# Patient Record
Sex: Female | Born: 1955 | Race: Black or African American | Hispanic: No | Marital: Married | Smoking: Never smoker
Health system: Southern US, Community
[De-identification: ages and names within clinical notes are randomized; demographics above are authoritative.]

## PROBLEM LIST (undated history)

## (undated) DIAGNOSIS — J302 Other seasonal allergic rhinitis: Secondary | ICD-10-CM

## (undated) DIAGNOSIS — F32A Depression, unspecified: Secondary | ICD-10-CM

## (undated) DIAGNOSIS — I341 Nonrheumatic mitral (valve) prolapse: Secondary | ICD-10-CM

## (undated) DIAGNOSIS — M1990 Unspecified osteoarthritis, unspecified site: Secondary | ICD-10-CM

## (undated) DIAGNOSIS — I10 Essential (primary) hypertension: Secondary | ICD-10-CM

## (undated) DIAGNOSIS — K219 Gastro-esophageal reflux disease without esophagitis: Secondary | ICD-10-CM

## (undated) DIAGNOSIS — F419 Anxiety disorder, unspecified: Secondary | ICD-10-CM

## (undated) DIAGNOSIS — J45909 Unspecified asthma, uncomplicated: Secondary | ICD-10-CM

## (undated) HISTORY — PX: ABDOMINAL HYSTERECTOMY: SHX81

## (undated) HISTORY — DX: Unspecified osteoarthritis, unspecified site: M19.90

## (undated) HISTORY — DX: Gastro-esophageal reflux disease without esophagitis: K21.9

## (undated) HISTORY — DX: Nonrheumatic mitral (valve) prolapse: I34.1

## (undated) HISTORY — DX: Unspecified asthma, uncomplicated: J45.909

---

## 2008-03-03 DIAGNOSIS — I639 Cerebral infarction, unspecified: Secondary | ICD-10-CM

## 2008-03-03 HISTORY — DX: Cerebral infarction, unspecified: I63.9

## 2013-08-01 HISTORY — PX: COLONOSCOPY: SHX174

## 2015-11-13 DIAGNOSIS — J4520 Mild intermittent asthma, uncomplicated: Secondary | ICD-10-CM | POA: Insufficient documentation

## 2015-11-13 DIAGNOSIS — J310 Chronic rhinitis: Secondary | ICD-10-CM | POA: Insufficient documentation

## 2015-12-24 ENCOUNTER — Encounter: Payer: Self-pay | Admitting: Internal Medicine

## 2015-12-25 ENCOUNTER — Other Ambulatory Visit: Payer: Self-pay | Admitting: Family Medicine

## 2015-12-25 DIAGNOSIS — Z1231 Encounter for screening mammogram for malignant neoplasm of breast: Secondary | ICD-10-CM

## 2016-01-09 ENCOUNTER — Encounter: Payer: Self-pay | Admitting: Radiology

## 2016-01-09 ENCOUNTER — Ambulatory Visit
Admission: RE | Admit: 2016-01-09 | Discharge: 2016-01-09 | Disposition: A | Discharge status: Home / Self Care | Payer: BLUE CROSS/BLUE SHIELD | Source: Ambulatory Visit | Attending: Family Medicine | Admitting: Family Medicine

## 2016-01-09 DIAGNOSIS — Z1231 Encounter for screening mammogram for malignant neoplasm of breast: Secondary | ICD-10-CM | POA: Diagnosis present

## 2016-01-10 ENCOUNTER — Ambulatory Visit (INDEPENDENT_AMBULATORY_CARE_PROVIDER_SITE_OTHER): Payer: BLUE CROSS/BLUE SHIELD | Admitting: Gastroenterology

## 2016-01-10 ENCOUNTER — Other Ambulatory Visit: Payer: Self-pay

## 2016-01-10 ENCOUNTER — Encounter: Payer: Self-pay | Admitting: Gastroenterology

## 2016-01-10 DIAGNOSIS — K219 Gastro-esophageal reflux disease without esophagitis: Secondary | ICD-10-CM

## 2016-01-10 DIAGNOSIS — R1013 Epigastric pain: Secondary | ICD-10-CM | POA: Diagnosis not present

## 2016-01-10 NOTE — Progress Notes (Signed)
Primary Care Physician:  WHITE, Arlyss RepressELIZABETH BURNEY, NP  Primary Gastroenterologist:  Roetta SessionsMichael Rourk, MD   Chief Complaint  Patient presents with  . Gastroesophageal Reflux    couple months, was taking Dexilant(stopped last yr)/Omeprazole (stopped last month), excess saliva    HPI:  Julie Mooney is a 60 y.o. female here at the request of PCP for further evaluation of GERD. She reports 2-3 year h/o GERD. Well-established with a gastroenterologist in [place] but none since moved to [place]. Reports prior EGD/TCS. Records have been requested. She says she has h/o ulcers, previously treated for H.pylori, no colon polyps.   Previously was on Dexilant. Worked very well for her heartburn symptoms but patient started having cramps and was forgetful and she was concerned it was due to the medication. She started Vit D, Magnesium, Calcium supplement on her own and stopped the Dexilant. She did ok for about six months. 3 months ago she started having bad GERD symptoms again. Nocturnal symptoms severe, waking her up at 3am consistently. She went back to visit her GI in [place] and started on omeprazole which seemed to help some during the daytime. Has also used a lot of TUMS, Pepcid, Zantac, Ginger tea.   Patient established care with her current PCP couple of months ago. Discussion was had regarding PPIs and potential nutritional deficiencies and associated with dementia. Patient was instructed to wean herself off omeprazole. Patient states she cannot tolerate being off PPI. Zantac recommended but did not help. She has now been back on omeprazole for one week and feeling some improvement. She denies dysphagia, vomiting, abdominal pain, constipation, diarrhea, melena, brbpr, unintentional weight loss.   Recent H.pylori breath test was negative.   Lipase 57H on 10/23/2015.  Current Outpatient Prescriptions  Medication Sig Dispense Refill  . albuterol (PROVENTIL HFA;VENTOLIN HFA) 108 (90 Base) MCG/ACT inhaler Inhale 2 puffs  into the lungs as needed.    Marland Kitchen. aspirin EC 81 MG tablet Take 81 mg by mouth daily.    . Multiple Vitamin (MULTI-VITAMINS) TABS Take 1 tablet by mouth daily.    . NON FORMULARY Vitamin D, zinc, calcium once a day    . olopatadine (PATANOL) 0.1 % ophthalmic solution Apply 2 drops to eye as needed.    Marland Kitchen. omeprazole (PRILOSEC) 20 MG capsule Take 20 mg by mouth daily.    . propranolol (INDERAL) 20 MG tablet Take 20 mg by mouth daily.     No current facility-administered medications for this visit.     Allergies as of 01/10/2016 - Review Complete 01/10/2016  Allergen Reaction Noted  . Shrimp [shellfish allergy] Other (See Comments) 01/10/2016    Past Medical History:  Diagnosis Date  . Arthritis   . Asthma   . GERD (gastroesophageal reflux disease)   . MVP (mitral valve prolapse)     Past Surgical History:  Procedure Laterality Date  . ABDOMINAL HYSTERECTOMY      Family History  Problem Relation Age of Onset  . Breast cancer Maternal Aunt 50  . Breast cancer Maternal Aunt   . Breast cancer Maternal Aunt   . Colon cancer Neg Hx     Social History   Social History  . Marital status: Married    Spouse name: N/A  . Number of children: N/A  . Years of education: N/A   Occupational History  . Not on file.   Social History Main Topics  . Smoking status: Never Smoker  . Smokeless tobacco: Never Used  . Alcohol use No  .  Drug use: Unknown  . Sexual activity: Not on file   Other Topics Concern  . Not on file   Social History Narrative   Retired - worked for school systems      ROS:  General: Negative for anorexia, weight loss, fever, chills, fatigue, weakness. Eyes: Negative for vision changes.  ENT: Negative for hoarseness, difficulty swallowing , nasal congestion. CV: Negative for chest pain, angina, palpitations, dyspnea on exertion, peripheral edema.  Respiratory: Negative for dyspnea at rest, dyspnea on exertion, cough, sputum, wheezing.  GI: See history of  present illness. GU:  Negative for dysuria, hematuria, urinary incontinence, urinary frequency, nocturnal urination.  MS: Negative for joint pain, low back pain.  Derm: Negative for rash or itching.  Neuro: Negative for weakness, abnormal sensation, seizure, frequent headaches, memory loss, confusion.  Psych: Negative for anxiety, depression, suicidal ideation, hallucinations.  Endo: Negative for unusual weight change.  Heme: Negative for bruising or bleeding. Allergy: Negative for rash or hives.    Physical Examination:  BP 111/81   Pulse 72   Temp 98.2 F (36.8 C) (Oral)   Ht 5\' 3"  (1.6 m)   Wt 108 lb 12.8 oz (49.4 kg)   BMI 19.27 kg/m    General: Well-nourished, well-developed in no acute distress.  Head: Normocephalic, atraumatic.   Eyes: Conjunctiva pink, no icterus. Mouth: Oropharyngeal mucosa moist and pink , no lesions erythema or exudate. Neck: Supple without thyromegaly, masses, or lymphadenopathy.  Lungs: Clear to auscultation bilaterally.  Heart: Regular rate and rhythm, no murmurs rubs or gallops.  Abdomen: Bowel sounds are normal, mild epigastric tenderness. nondistended, no hepatosplenomegaly or masses, no abdominal bruits or    hernia , no rebound or guarding. With palpation in the epigastrium, patient had frequent belching   Rectal: not performed Extremities: No lower extremity edema. No clubbing or deformities.  Neuro: Alert and oriented x 4 , grossly normal neurologically.  Skin: Warm and dry, no rash or jaundice.   Psych: Alert and cooperative, normal mood and affect.  Labs: Labs from August 2017 White blood cell count 9200, hemoglobin 11.9 slightly low, hematocrit 36, MCV 100 slightly high, platelets 255,000, total bilirubin 0.5, alkaline phosphatase 50, AST 26, ALT 20, albumin 4, lipase 57H  Imaging Studies: No results found.

## 2016-01-10 NOTE — Assessment & Plan Note (Addendum)
[age] year old female with several year history of typical GERD symptoms, reported ulcers, H. pylori status post eradication who presents with complaints of refractory heartburn, epigastric tenderness. She has had difficulty coming off of PPI therapy in the last couple months. Urged to discontinue therapy due to potential nutritional deficiency concerns and association with dementia at the request of her PCP. Patient complains predominantly of nocturnal symptoms. On exam she has epigastric tenderness. Minimally elevated lipase, slight macrocytic anemia. Recommend upper endoscopy for further evaluation of her symptoms. I have discussed the risks, alternatives, benefits with regards to but not limited to the risk of reaction to medication, bleeding, infection, perforation and the patient is agreeable to proceed. Written consent to be obtained.  She will continue omeprazole 20 mg daily for now. If upper endoscopy is unrevealing, she may need further workup. I suspect minimally elevated lipases insignificant finding but based on epigastric tenderness, if EGD is unremarkable, would consider possible imaging of the pancreas.

## 2016-01-10 NOTE — Patient Instructions (Signed)
No PA needed for EGD. Ref # [id number]

## 2016-01-10 NOTE — Patient Instructions (Signed)
1. Continue omeprazole 30 minutes before breakfast daily. 2. Upper endoscopy as scheduled. Please see separate instructions.

## 2016-01-14 ENCOUNTER — Ambulatory Visit (HOSPITAL_COMMUNITY)
Admission: RE | Admit: 2016-01-14 | Discharge: 2016-01-14 | Disposition: A | Discharge status: Home / Self Care | Payer: BLUE CROSS/BLUE SHIELD | Source: Ambulatory Visit | Attending: Internal Medicine | Admitting: Internal Medicine

## 2016-01-14 ENCOUNTER — Encounter (HOSPITAL_COMMUNITY): Payer: Self-pay | Admitting: *Deleted

## 2016-01-14 ENCOUNTER — Encounter (HOSPITAL_COMMUNITY)
Admission: RE | Disposition: A | Discharge status: Home / Self Care | Payer: Self-pay | Source: Ambulatory Visit | Attending: Internal Medicine

## 2016-01-14 DIAGNOSIS — Z91013 Allergy to seafood: Secondary | ICD-10-CM | POA: Diagnosis not present

## 2016-01-14 DIAGNOSIS — R1013 Epigastric pain: Secondary | ICD-10-CM | POA: Diagnosis not present

## 2016-01-14 DIAGNOSIS — Z803 Family history of malignant neoplasm of breast: Secondary | ICD-10-CM | POA: Diagnosis not present

## 2016-01-14 DIAGNOSIS — K219 Gastro-esophageal reflux disease without esophagitis: Secondary | ICD-10-CM | POA: Insufficient documentation

## 2016-01-14 DIAGNOSIS — M1990 Unspecified osteoarthritis, unspecified site: Secondary | ICD-10-CM | POA: Diagnosis not present

## 2016-01-14 DIAGNOSIS — Z79899 Other long term (current) drug therapy: Secondary | ICD-10-CM | POA: Diagnosis not present

## 2016-01-14 DIAGNOSIS — Z7982 Long term (current) use of aspirin: Secondary | ICD-10-CM | POA: Diagnosis not present

## 2016-01-14 DIAGNOSIS — J45909 Unspecified asthma, uncomplicated: Secondary | ICD-10-CM | POA: Diagnosis not present

## 2016-01-14 DIAGNOSIS — I341 Nonrheumatic mitral (valve) prolapse: Secondary | ICD-10-CM | POA: Insufficient documentation

## 2016-01-14 DIAGNOSIS — Z90710 Acquired absence of both cervix and uterus: Secondary | ICD-10-CM | POA: Diagnosis not present

## 2016-01-14 HISTORY — PX: ESOPHAGOGASTRODUODENOSCOPY: SHX5428

## 2016-01-14 HISTORY — DX: Other seasonal allergic rhinitis: J30.2

## 2016-01-14 SURGERY — EGD (ESOPHAGOGASTRODUODENOSCOPY)
Anesthesia: Moderate Sedation

## 2016-01-14 MED ORDER — LIDOCAINE VISCOUS 2 % MT SOLN
OROMUCOSAL | Status: AC
  Filled 2016-01-14: qty 15

## 2016-01-14 MED ORDER — MEPERIDINE HCL 100 MG/ML IJ SOLN
INTRAMUSCULAR | Status: DC | PRN
  Administered 2016-01-14: 25 mg via INTRAVENOUS
  Administered 2016-01-14: 50 mg via INTRAVENOUS

## 2016-01-14 MED ORDER — MIDAZOLAM HCL 5 MG/5ML IJ SOLN
INTRAMUSCULAR | Status: AC
  Filled 2016-01-14: qty 10

## 2016-01-14 MED ORDER — SODIUM CHLORIDE 0.9 % IV SOLN
INTRAVENOUS | Status: DC
  Administered 2016-01-14: 13:00:00 via INTRAVENOUS

## 2016-01-14 MED ORDER — STERILE WATER FOR IRRIGATION IR SOLN
Status: DC | PRN
  Administered 2016-01-14: 13:00:00

## 2016-01-14 MED ORDER — MIDAZOLAM HCL 5 MG/5ML IJ SOLN
INTRAMUSCULAR | Status: DC | PRN
  Administered 2016-01-14: 1 mg via INTRAVENOUS
  Administered 2016-01-14: 2 mg via INTRAVENOUS

## 2016-01-14 MED ORDER — MEPERIDINE HCL 100 MG/ML IJ SOLN
INTRAMUSCULAR | Status: AC
  Filled 2016-01-14: qty 2

## 2016-01-14 MED ORDER — LIDOCAINE VISCOUS 2 % MT SOLN
OROMUCOSAL | Status: DC | PRN
  Administered 2016-01-14: 1 via OROMUCOSAL

## 2016-01-14 MED ORDER — ONDANSETRON HCL 4 MG/2ML IJ SOLN
INTRAMUSCULAR | Status: DC | PRN
  Administered 2016-01-14: 4 mg via INTRAVENOUS

## 2016-01-14 MED ORDER — ONDANSETRON HCL 4 MG/2ML IJ SOLN
INTRAMUSCULAR | Status: AC
  Filled 2016-01-14: qty 2

## 2016-01-14 NOTE — H&P (View-Only) (Signed)
Primary Care Physician:  WHITE, ELIZABETH BURNEY, NP  Primary Gastroenterologist:  Michael Rourk, MD   Chief Complaint  Patient presents with  . Gastroesophageal Reflux    couple months, was taking Dexilant(stopped last yr)/Omeprazole (stopped last month), excess saliva    HPI:  Julie Mooney is a 60 y.o. female here at the request of PCP for further evaluation of GERD. She reports 2-3 year h/o GERD. Well-established with a gastroenterologist in [place] but none since moved to [place]. Reports prior EGD/TCS. Records have been requested. She says she has h/o ulcers, previously treated for H.pylori, no colon polyps.   Previously was on Dexilant. Worked very well for her heartburn symptoms but patient started having cramps and was forgetful and she was concerned it was due to the medication. She started Vit D, Magnesium, Calcium supplement on her own and stopped the Dexilant. She did ok for about six months. 3 months ago she started having bad GERD symptoms again. Nocturnal symptoms severe, waking her up at 3am consistently. She went back to visit her GI in [place] and started on omeprazole which seemed to help some during the daytime. Has also used a lot of TUMS, Pepcid, Zantac, Ginger tea.   Patient established care with her current PCP couple of months ago. Discussion was had regarding PPIs and potential nutritional deficiencies and associated with dementia. Patient was instructed to wean herself off omeprazole. Patient states she cannot tolerate being off PPI. Zantac recommended but did not help. She has now been back on omeprazole for one week and feeling some improvement. She denies dysphagia, vomiting, abdominal pain, constipation, diarrhea, melena, brbpr, unintentional weight loss.   Recent H.pylori breath test was negative.   Lipase 57H on 10/23/2015.  Current Outpatient Prescriptions  Medication Sig Dispense Refill  . albuterol (PROVENTIL HFA;VENTOLIN HFA) 108 (90 Base) MCG/ACT inhaler Inhale 2 puffs  into the lungs as needed.    . aspirin EC 81 MG tablet Take 81 mg by mouth daily.    . Multiple Vitamin (MULTI-VITAMINS) TABS Take 1 tablet by mouth daily.    . NON FORMULARY Vitamin D, zinc, calcium once a day    . olopatadine (PATANOL) 0.1 % ophthalmic solution Apply 2 drops to eye as needed.    . omeprazole (PRILOSEC) 20 MG capsule Take 20 mg by mouth daily.    . propranolol (INDERAL) 20 MG tablet Take 20 mg by mouth daily.     No current facility-administered medications for this visit.     Allergies as of 01/10/2016 - Review Complete 01/10/2016  Allergen Reaction Noted  . Shrimp [shellfish allergy] Other (See Comments) 01/10/2016    Past Medical History:  Diagnosis Date  . Arthritis   . Asthma   . GERD (gastroesophageal reflux disease)   . MVP (mitral valve prolapse)     Past Surgical History:  Procedure Laterality Date  . ABDOMINAL HYSTERECTOMY      Family History  Problem Relation Age of Onset  . Breast cancer Maternal Aunt 50  . Breast cancer Maternal Aunt   . Breast cancer Maternal Aunt   . Colon cancer Neg Hx     Social History   Social History  . Marital status: Married    Spouse name: N/A  . Number of children: N/A  . Years of education: N/A   Occupational History  . Not on file.   Social History Main Topics  . Smoking status: Never Smoker  . Smokeless tobacco: Never Used  . Alcohol use No  .   Drug use: Unknown  . Sexual activity: Not on file   Other Topics Concern  . Not on file   Social History Narrative   Retired - worked for school systems      ROS:  General: Negative for anorexia, weight loss, fever, chills, fatigue, weakness. Eyes: Negative for vision changes.  ENT: Negative for hoarseness, difficulty swallowing , nasal congestion. CV: Negative for chest pain, angina, palpitations, dyspnea on exertion, peripheral edema.  Respiratory: Negative for dyspnea at rest, dyspnea on exertion, cough, sputum, wheezing.  GI: See history of  present illness. GU:  Negative for dysuria, hematuria, urinary incontinence, urinary frequency, nocturnal urination.  MS: Negative for joint pain, low back pain.  Derm: Negative for rash or itching.  Neuro: Negative for weakness, abnormal sensation, seizure, frequent headaches, memory loss, confusion.  Psych: Negative for anxiety, depression, suicidal ideation, hallucinations.  Endo: Negative for unusual weight change.  Heme: Negative for bruising or bleeding. Allergy: Negative for rash or hives.    Physical Examination:  BP 111/81   Pulse 72   Temp 98.2 F (36.8 C) (Oral)   Ht 5\' 3"  (1.6 m)   Wt 108 lb 12.8 oz (49.4 kg)   BMI 19.27 kg/m    General: Well-nourished, well-developed in no acute distress.  Head: Normocephalic, atraumatic.   Eyes: Conjunctiva pink, no icterus. Mouth: Oropharyngeal mucosa moist and pink , no lesions erythema or exudate. Neck: Supple without thyromegaly, masses, or lymphadenopathy.  Lungs: Clear to auscultation bilaterally.  Heart: Regular rate and rhythm, no murmurs rubs or gallops.  Abdomen: Bowel sounds are normal, mild epigastric tenderness. nondistended, no hepatosplenomegaly or masses, no abdominal bruits or    hernia , no rebound or guarding. With palpation in the epigastrium, patient had frequent belching   Rectal: not performed Extremities: No lower extremity edema. No clubbing or deformities.  Neuro: Alert and oriented x 4 , grossly normal neurologically.  Skin: Warm and dry, no rash or jaundice.   Psych: Alert and cooperative, normal mood and affect.  Labs: Labs from August 2017 White blood cell count 9200, hemoglobin 11.9 slightly low, hematocrit 36, MCV 100 slightly high, platelets 255,000, total bilirubin 0.5, alkaline phosphatase 50, AST 26, ALT 20, albumin 4, lipase 57H  Imaging Studies: No results found.

## 2016-01-14 NOTE — Interval H&P Note (Signed)
History and Physical Interval Note:  01/14/2016 12:55 PM  Julie Mooney  has presented today for surgery, with the diagnosis of GERD, epig pain  The various methods of treatment have been discussed with the patient and family. After consideration of risks, benefits and other options for treatment, the patient has consented to  Procedure(s) with comments: ESOPHAGOGASTRODUODENOSCOPY (EGD) (N/A) - 1:30 pm as a surgical intervention .  The patient's history has been reviewed, patient examined, no change in status, stable for surgery.  I have reviewed the patient's chart and labs.  Questions were answered to the patient's satisfaction.     Julie Mooney  No change. Patient denies dysphagia. Diagnostic EGD for plan.  The risks, benefits, limitations, alternatives and imponderables have been reviewed with the patient. Questions have been answered. All parties are agreeable.

## 2016-01-14 NOTE — Discharge Instructions (Signed)
EGD Discharge instructions Please read the instructions outlined below and refer to this sheet in the next few weeks. These discharge instructions provide you with general information on caring for yourself after you leave the hospital. Your doctor may also give you specific instructions. While your treatment has been planned according to the most current medical practices available, unavoidable complications occasionally occur. If you have any problems or questions after discharge, please call your doctor. ACTIVITY  You may resume your regular activity but move at a slower pace for the next 24 hours.   Take frequent rest periods for the next 24 hours.   Walking will help expel (get rid of) the air and reduce the bloated feeling in your abdomen.   No driving for 24 hours (because of the anesthesia (medicine) used during the test).   You may shower.   Do not sign any important legal documents or operate any machinery for 24 hours (because of the anesthesia used during the test).  NUTRITION  Drink plenty of fluids.   You may resume your normal diet.   Begin with a light meal and progress to your normal diet.   Avoid alcoholic beverages for 24 hours or as instructed by your caregiver.  MEDICATIONS  You may resume your normal medications unless your caregiver tells you otherwise.  WHAT YOU CAN EXPECT TODAY  You may experience abdominal discomfort such as a feeling of fullness or gas pains.  FOLLOW-UP  Your doctor will discuss the results of your test with you.  SEEK IMMEDIATE MEDICAL ATTENTION IF ANY OF THE FOLLOWING OCCUR:  Excessive nausea (feeling sick to your stomach) and/or vomiting.   Severe abdominal pain and distention (swelling).   Trouble swallowing.   Temperature over 101 F (37.8 C).   Rectal bleeding or vomiting of blood.    Your EGD was normal today  Try a course of Protonix 40 mg daily  We'll order a CT of the abdomen and pelvis-pancreatic protocol to  further evaluate your abdominal pain  Office visit with us in 4-6 weeks  Further recommendations to follow.

## 2016-01-15 ENCOUNTER — Other Ambulatory Visit: Payer: Self-pay

## 2016-01-15 ENCOUNTER — Telehealth: Payer: Self-pay

## 2016-01-15 DIAGNOSIS — R109 Unspecified abdominal pain: Secondary | ICD-10-CM

## 2016-01-15 NOTE — Telephone Encounter (Signed)
noted 

## 2016-01-15 NOTE — Telephone Encounter (Signed)
Pt would like to talk with RMR about the CT scan. She does not understand why she needs to have it done. I explained it was to eval for her abd pain and she said that she is not having any pain. She just would like to talk with RMR before the CT scan (which is on 01/21/16). Her contact number is [phone]

## 2016-01-15 NOTE — Telephone Encounter (Signed)
I Called patient. States abdominal pain is better. Has concerns about getting the CT scan. Doesn't think she needs it. Reviewed negative EGD findings. I suggested we hold off on the CT and just see her back in the office on December 18  -  see how she is doing and go from there.

## 2016-01-15 NOTE — Progress Notes (Signed)
CC'ED TO PCP 

## 2016-01-16 NOTE — Op Note (Signed)
[facility] Patient Name: Julie MedicoJanet Mooney Procedure Date: 01/14/2016 12:53 PM MRN: [id number] Date of Birth: May 11, 1955 Attending MD: Gennette Pacobert Michael Rourk , MD CSN: [id number] Age: [age] Admit Type: Outpatient Procedure:                Upper GI endoscopy Indications:              Epigastric abdominal pain Providers:                Gennette Pacobert Michael Rourk, MD, Jannett CelestineAnitra Bell, RN, Birder Robsonebra                            [place], Technician Referring MD:              Medicines:                Midazolam 2 mg IV, Meperidine 50 mg IV, Ondansetron                            4 mg IV Complications:            No immediate complications. Estimated Blood Loss:     Estimated blood loss: none. Procedure:                Pre-Anesthesia Assessment:                           - Prior to the procedure, a History and Physical                            was performed, and patient medications and                            allergies were reviewed. The patient's tolerance of                            previous anesthesia was also reviewed. The risks                            and benefits of the procedure and the sedation                            options and risks were discussed with the patient.                            All questions were answered, and informed consent                            was obtained. Prior Anticoagulants: The patient has                            taken no previous anticoagulant or antiplatelet                            agents. ASA Grade Assessment: III - A patient with  severe systemic disease. After reviewing the risks                            and benefits, the patient was deemed in                            satisfactory condition to undergo the procedure.                           After obtaining informed consent, the endoscope was                            passed under direct vision. Throughout the                            procedure, the patient's  blood pressure, pulse, and                            oxygen saturations were monitored continuously. The                            [id number] ([id number]) scope was introduced through the                            mouth, and advanced to the second part of duodenum.                            The upper GI endoscopy was accomplished without                            difficulty. The patient tolerated the procedure                            well. Scope In: 1:14:35 PM Scope Out: 1:18:13 PM Total Procedure Duration: 0 hours 3 minutes 38 seconds  Findings:      The examined esophagus was normal.      The in the stomach was normal.      The duodenal bulb and second portion of the duodenum were normal. Impression:               - Normal esophagus.                           - Normal stomach.                           - Normal duodenal bulb and second portion of the                            duodenum.                           - No specimens collected. Moderate Sedation:      Moderate (conscious) sedation was administered by the endoscopy nurse       and supervised by the endoscopist. The following parameters were  monitored: oxygen saturation, heart rate, blood pressure, respiratory       rate, EKG, adequacy of pulmonary ventilation, and response to care.       Total physician intraservice time was 15 minutes. Recommendation:           - Patient has a contact number available for                            emergencies. The signs and symptoms of potential                            delayed complications were discussed with the                            patient. Return to normal activities tomorrow.                            Written discharge instructions were provided to the                            patient.                           - Resume previous diet. Proceed with a pancreatic                            protocol CT scan. Trial of Protonix 40 mg daily.                           -  Continue present medications.                           - No repeat upper endoscopy.                           - Return to GI office (date not yet determined). Procedure Code(s):        --- Professional ---                           [phone], Moderate sedation services provided by the                            same physician or other qualified health care                            professional performing the diagnostic or                            therapeutic service that the sedation supports,                            requiring the presence of an independent trained                            observer to assist in the monitoring of the  patient's level of consciousness and physiological                            status; initial 15 minutes of intraservice time,                            patient age 29 years or older Diagnosis Code(s):        --- Professional ---                           R10.13, Epigastric pain CPT copyright 2016 American Medical Association. All rights reserved. The codes documented in this report are preliminary and upon coder review may  be revised to meet current compliance requirements. Gerrit Friends. Rourk, MD Gennette Pac, MD 01/16/2016 1:44:12 PM This report has been signed electronically. Number of Addenda: 0

## 2016-01-17 ENCOUNTER — Encounter (HOSPITAL_COMMUNITY): Payer: Self-pay | Admitting: Internal Medicine

## 2016-01-17 NOTE — Telephone Encounter (Signed)
Pt called and wanted to go ahead and schedule her Ct scan. She is set up for 01/22/16 @ 6:54 pm and she is aware.

## 2016-01-21 ENCOUNTER — Ambulatory Visit (HOSPITAL_COMMUNITY): Payer: BLUE CROSS/BLUE SHIELD

## 2016-01-21 ENCOUNTER — Other Ambulatory Visit: Payer: Self-pay

## 2016-01-21 NOTE — Patient Instructions (Signed)
Attempted to get Authorization for CT Abd/pelvis with and without contrast that is scheduled for 01/22/16. Clinical reviewer sending info to physician reviewer. Ref# [id number].

## 2016-01-22 ENCOUNTER — Telehealth: Payer: Self-pay

## 2016-01-22 ENCOUNTER — Ambulatory Visit (HOSPITAL_COMMUNITY): Admission: RE | Admit: 2016-01-22 | Payer: BLUE CROSS/BLUE SHIELD | Source: Ambulatory Visit

## 2016-01-22 NOTE — Telephone Encounter (Signed)
Pt called and said CT abd/pelvis that was scheduled for today had to be rescheduled for 01/29/16 d/t her insurance hasn't approved test yet. Pt also said she has mitral valve prolapse and requires antibiotic for invasive procedures (fyi).

## 2016-01-23 NOTE — Telephone Encounter (Signed)
Unable to access case on [employer] website. Case# [id number]. Called [employer] [phone]([id number]), case is still pending.

## 2016-01-28 NOTE — Telephone Encounter (Signed)
Patient called back with an approval number for her ct scan #[id number]  I also [facility] and made [facility] aware.

## 2016-01-28 NOTE — Telephone Encounter (Signed)
I [facility] a [facility] from [facility] stating [facility] was [facility].  I called [employer] and spoke with Rosamaria LintsMyiesha W and she stated they had [facility] in [place] listed as the imaging site.  I was told that the patient needed to call [employer] at [phone] and she would have to change the imaging center to [facility].    She needs to call [employer] at [phone] and speak with her benefits coordinator and change her address, because they still have a [place], [place] address on file for her.  She would then need to call [employer] at [phone] and change the facility to [facility].  I called the patient and made her aware of this.

## 2016-01-29 ENCOUNTER — Ambulatory Visit (HOSPITAL_COMMUNITY)
Admission: RE | Admit: 2016-01-29 | Discharge: 2016-01-29 | Disposition: A | Discharge status: Home / Self Care | Payer: BLUE CROSS/BLUE SHIELD | Source: Ambulatory Visit | Attending: Internal Medicine | Admitting: Internal Medicine

## 2016-01-29 DIAGNOSIS — R109 Unspecified abdominal pain: Secondary | ICD-10-CM | POA: Diagnosis not present

## 2016-01-29 LAB — POCT I-STAT CREATININE: Creatinine, Ser: 0.9 mg/dL (ref 0.44–1.00)

## 2016-01-29 MED ORDER — IOPAMIDOL (ISOVUE-300) INJECTION 61%
100.0000 mL | Freq: Once | INTRAVENOUS | Status: AC | PRN
  Administered 2016-01-29: 100 mL via INTRAVENOUS

## 2016-01-29 NOTE — Telephone Encounter (Signed)
I also spoke with Latoya in member services and she made the change from [facility] to [facility] and the reference number is [id number]

## 2016-01-29 NOTE — Telephone Encounter (Signed)
I was on a conference call with the patient and [employer](Mike)and we were able to change the imaging site.  Reference number [id number] U to show the change for the imaging site from [facility] to [facility]

## 2016-01-30 ENCOUNTER — Encounter: Payer: Self-pay | Admitting: Gastroenterology

## 2016-01-30 NOTE — Progress Notes (Signed)
Patient was made aware of RMR's recommendations and will be coming by our office in the morning to get lab order and she has an appt 03/20/16 with LL at 1:30 and she is aware.  Julie FanningJulie, please print off the lab order

## 2016-01-31 ENCOUNTER — Other Ambulatory Visit: Payer: Self-pay

## 2016-01-31 ENCOUNTER — Other Ambulatory Visit: Payer: Self-pay | Admitting: Internal Medicine

## 2016-01-31 DIAGNOSIS — K760 Fatty (change of) liver, not elsewhere classified: Secondary | ICD-10-CM

## 2016-02-01 LAB — HEPATIC FUNCTION PANEL
ALBUMIN: 3.8 g/dL (ref 3.6–5.1)
ALT: 14 U/L (ref 6–29)
AST: 19 U/L (ref 10–35)
Alkaline Phosphatase: 40 U/L (ref 33–130)
BILIRUBIN INDIRECT: 0.4 mg/dL (ref 0.2–1.2)
Bilirubin, Direct: 0.1 mg/dL (ref <=0.2)
TOTAL PROTEIN: 6.1 g/dL (ref 6.1–8.1)
Total Bilirubin: 0.5 mg/dL (ref 0.2–1.2)

## 2016-02-06 ENCOUNTER — Telehealth: Payer: Self-pay | Admitting: General Practice

## 2016-02-06 NOTE — Telephone Encounter (Signed)
Patient called in to get her results from her lab work.  I made her aware of her results and told her she should be receiving a letter in the mail.

## 2016-02-18 ENCOUNTER — Ambulatory Visit: Payer: BLUE CROSS/BLUE SHIELD | Admitting: Gastroenterology

## 2016-03-19 ENCOUNTER — Encounter: Payer: Self-pay | Admitting: Gastroenterology

## 2016-03-19 ENCOUNTER — Telehealth: Payer: Self-pay | Admitting: Gastroenterology

## 2016-03-19 NOTE — Telephone Encounter (Signed)
Reviewed records from outside source.  TCS tortuosity of sigmoid colon, moderatly severe proctitis and grade II internal hemorrhoids.   Please see if we can get bx result from [facility]. Dr. Janith LimaSerena Lee.

## 2016-03-20 ENCOUNTER — Ambulatory Visit: Payer: BLUE CROSS/BLUE SHIELD | Admitting: Gastroenterology

## 2016-03-27 NOTE — Telephone Encounter (Signed)
Requested path report

## 2016-04-02 ENCOUNTER — Encounter: Payer: Self-pay | Admitting: Internal Medicine

## 2016-04-11 ENCOUNTER — Ambulatory Visit (INDEPENDENT_AMBULATORY_CARE_PROVIDER_SITE_OTHER): Payer: BLUE CROSS/BLUE SHIELD | Admitting: Gastroenterology

## 2016-04-11 ENCOUNTER — Encounter: Payer: Self-pay | Admitting: Gastroenterology

## 2016-04-11 VITALS — BP 127/76 | HR 67 | Temp 97.6°F | Ht 63.0 in | Wt 110.8 lb

## 2016-04-11 DIAGNOSIS — K219 Gastro-esophageal reflux disease without esophagitis: Secondary | ICD-10-CM | POA: Diagnosis not present

## 2016-04-11 NOTE — Assessment & Plan Note (Signed)
61 year old lady with several year history of typical heartburn which responds to PPI therapy. Because of concerns for potential nutritional deficiencies, recent concerns for dementia related to PPI therapy (claims have not been supported with recent studies) she was advised to come off PPI therapy by her PCP and developed recurrent heartburn. Symptoms have now settled back down, she is taking pantoprazole about 3-4 times weekly. I have advised her to continue taking pantoprazole 3-4 times weekly, may use over-the-counter Rolaids or Tums for when necessary symptoms in between. If however she continues to have significant breakthrough heartburn with this regimen, would advise her to take pantoprazole daily. Reinforced anti-reflex measures. We'll have her come back in one year or she can call sooner if needed.  Currently she is not having any lower GI symptoms. Last colonoscopy 2015, she denies any history of polyps.

## 2016-04-11 NOTE — Patient Instructions (Signed)
1. As discussed, since you would like to minimize you of prescription reflux medications, try taking protonix once every 2-3 days for your reflux. You can take OTC antacids like tums or rolaids as needed.  2. We will see you back in one year or sooner if needed.   Food Choices for Gastroesophageal Reflux Disease, Adult When you have gastroesophageal reflux disease (GERD), the foods you eat and your eating habits are very important. Choosing the right foods can help ease the discomfort of GERD. What general guidelines do I need to follow?  Choose fruits, vegetables, whole grains, low-fat dairy products, and low-fat meat, fish, and poultry.  Limit fats such as oils, salad dressings, butter, nuts, and avocado.  Keep a food diary to identify foods that cause symptoms.  Avoid foods that cause reflux. These may be different for different people.  Eat frequent small meals instead of three large meals each day.  Eat your meals slowly, in a relaxed setting.  Limit fried foods.  Cook foods using methods other than frying.  Avoid drinking alcohol.  Avoid drinking large amounts of liquids with your meals.  Avoid bending over or lying down until 2-3 hours after eating. What foods are not recommended? The following are some foods and drinks that may worsen your symptoms: Vegetables  Tomatoes. Tomato juice. Tomato and spaghetti sauce. Chili peppers. Onion and garlic. Horseradish. Fruits  Oranges, grapefruit, and lemon (fruit and juice). Meats  High-fat meats, fish, and poultry. This includes hot dogs, ribs, ham, sausage, salami, and bacon. Dairy  Whole milk and chocolate milk. Sour cream. Cream. Butter. Ice cream. Cream cheese. Beverages  Coffee and tea, with or without caffeine. Carbonated beverages or energy drinks. Condiments  Hot sauce. Barbecue sauce. Sweets/Desserts  Chocolate and cocoa. Donuts. Peppermint and spearmint. Fats and Oils  High-fat foods, including [place] fries and  potato chips. Other  Vinegar. Strong spices, such as black pepper, white pepper, red pepper, cayenne, curry powder, cloves, ginger, and chili powder. The items listed above may not be a complete list of foods and beverages to avoid. Contact your dietitian for more information.  This information is not intended to replace advice given to you by your health care provider. Make sure you discuss any questions you have with your health care provider. Document Released: 02/17/2005 Document Revised: 07/26/2015 Document Reviewed: 12/22/2012 Elsevier Interactive Patient Education  2017 [employer]

## 2016-04-11 NOTE — Progress Notes (Signed)
Primary Care Physician: WHITE, Arlyss RepressELIZABETH BURNEY, NP  Primary Gastroenterologist:  Roetta SessionsMichael Rourk, MD   Chief Complaint  Patient presents with  . Gastroesophageal Reflux    HPI: Julie Mooney is a 61 y.o. female here for follow-up of GERD. She was last seen in November at the request of PCP. History of GERD for several years. Previously well-established with gastroenterologist in [place] but has since moved to [place]. She reports previous EGD/colonoscopy we've requested records and only received a colonoscopy report which showed sigmoid colon tortuosity, moderately severe proctitis, internal grade 2 hemorrhoids. There was no mention of biopsies being done, we have requested any possible records but have not received anything to date. Patient reports previously being treated for H. pylori and has a history of ulcers.   When she presented back in November her new PCP had discussed potential nutritional deficiencies related to PPIs as well as potential dementia and instructed her to wean herself off the omeprazole. When she did she had significant recurrent heartburn symptoms. Zantac did not provide any relief. When I saw her she been back on omeprazole for 1 week with improvement of her symptoms. H pylori breath test was negative. In August 2017 she had a minimally elevated lipase 57.  EGD in November 2017 by Dr. Jena Gaussourk was entirely normal. He arranged for pancreatic protocol CT which showed scattered lesions in the liver measuring up to 8 mm, too small to characterize but felt to be accommodation of cyst or hematomas. 3.5 cm left hepatic lobe lesion consistent with hemangioma. Probable focal fat in the medial left hepatic lobe. Gallbladder unremarkable. Mild intrahepatic biliary dilation but normal CBD. Pancreas was unremarkable.  Patient states that overall she has been doing well back on PPI therapy. At this time she is taking pantoprazole but has omeprazole at home as well.  Currently not taking daily. Still afraid to take daily because of concerns with memory issues, states takes Dexilant caused her to forget things. Denies dysphagia, abdominal pain. Appetite is good. Briefly discussed antireflux surgery but she's not interested. Reinforced anti-reflex measures. Certain foods safely set her symptoms off. Also worse with stress.    Current Outpatient Prescriptions  Medication Sig Dispense Refill  . albuterol (PROVENTIL HFA;VENTOLIN HFA) 108 (90 Base) MCG/ACT inhaler Inhale 2 puffs into the lungs every 6 (six) hours as needed for wheezing or shortness of breath.     Marland Kitchen. aspirin EC 81 MG tablet Take 81 mg by mouth daily.    . Chlorphen-Pseudoephed-APAP (TYLENOL ALLERGY SINUS PO) Take 1 tablet by mouth daily as needed. For sinuses    . Multiple Vitamin (MULTI-VITAMINS) TABS Take 1 tablet by mouth daily.    . NON FORMULARY Vitamin D, zinc, calcium once a day    . olopatadine (PATANOL) 0.1 % ophthalmic solution Place 2 drops into both eyes 2 (two) times daily.     Marland Kitchen. omeprazole (PRILOSEC) 20 MG capsule Take 20 mg by mouth daily.    . propranolol (INDERAL) 20 MG tablet Take 20 mg by mouth daily.     No current facility-administered medications for this visit.     Allergies as of 04/11/2016 - Review Complete 04/11/2016  Allergen Reaction Noted  . Shrimp [shellfish allergy] Other (See Comments) 01/10/2016    ROS:  General: Negative for anorexia, weight loss, fever, chills, fatigue, weakness. ENT: Negative for hoarseness, difficulty swallowing , nasal congestion. CV: Negative for chest pain, angina, palpitations, dyspnea on exertion, peripheral edema.  Respiratory: Negative for  dyspnea at rest, dyspnea on exertion, cough, sputum, wheezing.  GI: See history of present illness. GU:  Negative for dysuria, hematuria, urinary incontinence, urinary frequency, nocturnal urination.  Endo: Negative for unusual weight change.    Physical Examination:   BP 127/76   Pulse 67    Temp 97.6 F (36.4 C) (Oral)   Ht 5\' 3"  (1.6 m)   Wt 110 lb 12.8 oz (50.3 kg)   BMI 19.63 kg/m   General: Well-nourished, well-developed in no acute distress.  Eyes: No icterus. Mouth: Oropharyngeal mucosa moist and pink , no lesions erythema or exudate. Lungs: Clear to auscultation bilaterally.  Heart: Regular rate and rhythm, no murmurs rubs or gallops.  Abdomen: Bowel sounds are normal, nontender, nondistended, no hepatosplenomegaly or masses, no abdominal bruits or hernia , no rebound or guarding.   Extremities: No lower extremity edema. No clubbing or deformities. Neuro: Alert and oriented x 4   Skin: Warm and dry, no jaundice.   Psych: Alert and cooperative, normal mood and affect.  Labs:  Lab Results  Component Value Date   ALT 14 01/31/2016   AST 19 01/31/2016   ALKPHOS 40 01/31/2016   BILITOT 0.5 01/31/2016    Imaging Studies: No results found.

## 2016-04-14 NOTE — Progress Notes (Signed)
CC'D TO PCP °

## 2016-07-09 DIAGNOSIS — I341 Nonrheumatic mitral (valve) prolapse: Secondary | ICD-10-CM | POA: Insufficient documentation

## 2017-01-13 ENCOUNTER — Other Ambulatory Visit: Payer: Self-pay | Admitting: Family Medicine

## 2017-01-13 DIAGNOSIS — Z1231 Encounter for screening mammogram for malignant neoplasm of breast: Secondary | ICD-10-CM

## 2017-02-05 ENCOUNTER — Ambulatory Visit
Admission: RE | Admit: 2017-02-05 | Discharge: 2017-02-05 | Disposition: A | Discharge status: Home / Self Care | Payer: BLUE CROSS/BLUE SHIELD | Source: Ambulatory Visit | Attending: Family Medicine | Admitting: Family Medicine

## 2017-02-05 DIAGNOSIS — Z1231 Encounter for screening mammogram for malignant neoplasm of breast: Secondary | ICD-10-CM | POA: Diagnosis not present

## 2017-03-10 ENCOUNTER — Encounter: Payer: Self-pay | Admitting: Internal Medicine

## 2017-06-23 ENCOUNTER — Encounter: Payer: Self-pay | Admitting: Nurse Practitioner

## 2017-06-23 ENCOUNTER — Ambulatory Visit: Payer: BLUE CROSS/BLUE SHIELD | Admitting: Nurse Practitioner

## 2017-06-23 VITALS — BP 107/67 | HR 66 | Temp 97.0°F | Ht 63.0 in | Wt 108.8 lb

## 2017-06-23 DIAGNOSIS — R143 Flatulence: Secondary | ICD-10-CM | POA: Diagnosis not present

## 2017-06-23 DIAGNOSIS — K219 Gastro-esophageal reflux disease without esophagitis: Secondary | ICD-10-CM

## 2017-06-23 DIAGNOSIS — R1013 Epigastric pain: Secondary | ICD-10-CM

## 2017-06-23 NOTE — Assessment & Plan Note (Signed)
Overall no epigastric abdominal pain.  This is consistent with likely improvement in GERD symptoms.  Recommend continue PPI as needed as she is resistant to taking this daily.  Follow-up in 2 months.

## 2017-06-23 NOTE — Progress Notes (Signed)
cc'ed to pcp °

## 2017-06-23 NOTE — Assessment & Plan Note (Signed)
She has noted increased flatulence with specific foods.  I recommended she keep a log to identify triggers and avoid these.  She feels she may be lactose intolerant.  I have recommended she go on a lactose-free diet for 2 weeks.  If this resolves her symptoms we can discuss lactose-free options for her.  She wishes to continue a gluten-free diet as well. Follow-up in 2 months.

## 2017-06-23 NOTE — Assessment & Plan Note (Signed)
Overall, GERD seems to be doing okay.  The patient is still resistant to taking daily PPI.  I will provide her with education related to adverse effects from PPIs.  Recommend she take PPI as needed if she will not take it every day.  Continue other current medications.  Follow-up in 2 months.  Last EGD completely normal 2017.

## 2017-06-23 NOTE — Patient Instructions (Signed)
1. Continue to take your acid blocker as needed.  Ideally, you should take it every day. 2. I am giving you a Pranau related to side effects from acid blockers. 3. Attempt a dairy free/lactose-free diet for 2 weeks to see if this helps her gas symptoms. 4. If it does, we can discuss options for dairy free diet to prevent symptoms as well as options for when you do consume dairy. 5. Return for follow-up in 2 months. 6. Continue her other medications. 7. Call if you have any questions or concerns.   It was great to see you today!  You have a wonderful summer!!!     At [facility] we value your feedback. You may receive a survey about your visit today. Please share your experience as we strive to create trusting relationships with our patients to provide genuine, compassionate, quality care.

## 2017-06-23 NOTE — Progress Notes (Signed)
Referring Provider: Titus Mould* Primary Care Physician:  Titus Mould, NP Primary GI:  Dr. Jena Gauss  Chief Complaint  Patient presents with  . Gastroesophageal Reflux    f/u. C/o lots of gas with certain foods    HPI:   Julie Mooney is a 62 y.o. female who presents for follow-up on GERD.  The patient was last seen in our office 04/11/2016 for the same.  Noted chronic history of GERD for multiple years, previously established with GI in [place] but since moved to [place].  Previous request for EGD and colonoscopy records with only colonoscopy report returned which showed sigmoid colon tortuosity, moderately severe proctitis, internal grade 2 hemorrhoids.  Patient reported history of H. pylori status post treatment and history of ulcers.  Primary care recommended she wean off PPIs due to potential nutritional deficiencies and potential dementia.  When she did so she had significant heartburn symptoms and Zantac did not improve her symptoms.  She went back on omeprazole for 1 week and had improvement.    EGD up-to-date November 2017 entirely normal.    Pancreatic protocol CT showed scattered lesions of the liver measuring up to 8 mm too small to characterize but felt to be a combination of cysts or hemangiomas.  3.5 cm left hepatic lobe lesion consistent with hemangioma.  Probable focal fat in the medial left hepatic lobe.  Mild intrahepatic biliary dilation but normal CBD.  Pancreas unremarkable.  At her last visit she was doing well back on PPI, still taking pantoprazole but has omeprazole as well but not taking either daily.  She stated taking Dexilant caused her to forget things.  No other GI symptoms.  Discussion of antireflux surgery was done but the patient is not interested.  Reinforced antireflux measures and trigger avoidance.  Recommended she try Protonix every 2-3 days and over-the-counter antacids like Tums or Rolaids as needed.  Follow-up in 1  year.  Today she states she's been better. Was doing well when she went gluten free for a year, stopped PPI and was doing well. Her mother passed in February or March, started "eating everything" and had return of symptoms. Having increased gas with certain foods (especially fried foods; wonders if she has lactose intolerance). Started taking probiotics. In March she took PPI for a week. Fells better not not completely. Denies abdominal pain, N/V, hematochezia, melena, fever, chills, unintentional weight loss. Denies esophageal burning, bitter taste. When re-asked, feels reflux is probably doing pretty well with re-improved diet. Biggest concern is gas with certain foods.  Past Medical History:  Diagnosis Date  . Arthritis   . Asthma   . GERD (gastroesophageal reflux disease)   . MVP (mitral valve prolapse)   . Seasonal allergies     Past Surgical History:  Procedure Laterality Date  . ABDOMINAL HYSTERECTOMY    . COLONOSCOPY  08/2013   [facility]: tortuosity of sigm colon, moderately severe proctitis internal grade II hemorrhoids.   . ESOPHAGOGASTRODUODENOSCOPY N/A 01/14/2016   Procedure: ESOPHAGOGASTRODUODENOSCOPY (EGD);  Surgeon: Corbin Ade, MD;  Location: [facility];  Service: Endoscopy;  Laterality: N/A;  1:30 pm    Current Outpatient Medications  Medication Sig Dispense Refill  . albuterol (PROVENTIL HFA;VENTOLIN HFA) 108 (90 Base) MCG/ACT inhaler Inhale 2 puffs into the lungs every 6 (six) hours as needed for wheezing or shortness of breath.     Marland Kitchen aspirin EC 81 MG tablet Take 81 mg by mouth daily.    Marland Kitchen  Chlorphen-Pseudoephed-APAP (TYLENOL ALLERGY SINUS PO) Take 1 tablet by mouth daily as needed. For sinuses    . Multiple Vitamin (MULTI-VITAMINS) TABS Take 1 tablet by mouth daily.    . NON FORMULARY Vitamin D, zinc, calcium once a day    . olopatadine (PATANOL) 0.1 % ophthalmic solution Place 2 drops into both eyes as needed.     . propranolol (INDERAL) 20 MG tablet  Take 20 mg by mouth daily.     No current facility-administered medications for this visit.     Allergies as of 06/23/2017 - Review Complete 06/23/2017  Allergen Reaction Noted  . Shrimp [shellfish allergy] Other (See Comments) 01/10/2016    Family History  Problem Relation Age of Onset  . Breast cancer Maternal Aunt 50  . Breast cancer Maternal Aunt   . Breast cancer Maternal Aunt   . Colon cancer Neg Hx     Social History   Socioeconomic History  . Marital status: Married    Spouse name: Not on file  . Number of children: Not on file  . Years of education: Not on file  . Highest education level: Not on file  Occupational History  . Not on file  Social Needs  . Financial resource strain: Not on file  . Food insecurity:    Worry: Not on file    Inability: Not on file  . Transportation needs:    Medical: Not on file    Non-medical: Not on file  Tobacco Use  . Smoking status: Never Smoker  . Smokeless tobacco: Never Used  Substance and Sexual Activity  . Alcohol use: No  . Drug use: Never  . Sexual activity: Not on file  Lifestyle  . Physical activity:    Days per week: Not on file    Minutes per session: Not on file  . Stress: Not on file  Relationships  . Social connections:    Talks on phone: Not on file    Gets together: Not on file    Attends religious service: Not on file    Active member of club or organization: Not on file    Attends meetings of clubs or organizations: Not on file    Relationship status: Not on file  Other Topics Concern  . Not on file  Social History Narrative   Retired - worked for school systems    Review of Systems: General: Negative for anorexia, weight loss, fever, chills, fatigue, weakness. ENT: Negative for hoarseness, difficulty swallowing. CV: Negative for chest pain, angina, palpitations, peripheral edema.  Respiratory: Negative for dyspnea at rest, cough, sputum, wheezing.  GI: See history of present illness. Endo:  Negative for unusual weight change.  Heme: Negative for bruising or bleeding.   Physical Exam: BP 107/67   Pulse 66   Temp (!) 97 F (36.1 C) (Oral)   Ht 5\' 3"  (1.6 m)   Wt 108 lb 12.8 oz (49.4 kg)   BMI 19.27 kg/m  General:   Alert and oriented. Pleasant and cooperative. Well-nourished and well-developed.  Eyes:  Without icterus, sclera clear and conjunctiva pink.  Ears:  Normal auditory acuity. Cardiovascular:  S1, S2 present without murmurs appreciated. Extremities without clubbing or edema. Respiratory:  Clear to auscultation bilaterally. No wheezes, rales, or rhonchi. No distress.  Gastrointestinal:  +BS, soft, non-tender and non-distended. No HSM noted. No guarding or rebound. No masses appreciated.  Rectal:  Deferred  Musculoskalatal:  Symmetrical without gross deformities. Skin:  Intact without significant lesions or rashes. Neurologic:  Alert and oriented x4;  grossly normal neurologically. Psych:  Alert and cooperative. Normal mood and affect. Heme/Lymph/Immune: No excessive bruising noted.    06/23/2017 10:02 AM   Disclaimer: This note was dictated with voice recognition software. Similar sounding words can inadvertently be transcribed and may not be corrected upon review.

## 2017-06-24 ENCOUNTER — Telehealth: Payer: Self-pay | Admitting: Internal Medicine

## 2017-06-24 NOTE — Telephone Encounter (Signed)
[phone] patient would like protonix sent to [facility] on [address], [address]

## 2017-06-24 NOTE — Telephone Encounter (Signed)
Routing to [facility] refill 

## 2017-06-25 MED ORDER — PANTOPRAZOLE SODIUM 40 MG PO TBEC: 40.0000 mg | DELAYED_RELEASE_TABLET | Freq: Every day | ORAL | 3 refills | Status: DC

## 2017-06-25 NOTE — Addendum Note (Signed)
Addended by: Tiffany KocherLEWIS, Leata Dominy S on: 06/25/2017 01:32 PM   Modules accepted: Orders

## 2017-06-25 NOTE — Telephone Encounter (Signed)
Pt returned call and is aware that her Rx was sent to her pharmacy.

## 2017-06-25 NOTE — Telephone Encounter (Signed)
Noted, tried leaving a message for pt, mailbox is full.

## 2017-08-27 ENCOUNTER — Ambulatory Visit: Payer: BLUE CROSS/BLUE SHIELD | Admitting: Gastroenterology

## 2017-09-10 ENCOUNTER — Ambulatory Visit: Payer: BLUE CROSS/BLUE SHIELD | Admitting: Nurse Practitioner

## 2017-09-10 ENCOUNTER — Encounter: Payer: Self-pay | Admitting: Nurse Practitioner

## 2017-09-10 VITALS — BP 126/80 | HR 65 | Temp 97.0°F | Ht 63.0 in | Wt 108.4 lb

## 2017-09-10 DIAGNOSIS — K219 Gastro-esophageal reflux disease without esophagitis: Secondary | ICD-10-CM | POA: Diagnosis not present

## 2017-09-10 DIAGNOSIS — R1013 Epigastric pain: Secondary | ICD-10-CM

## 2017-09-10 NOTE — Progress Notes (Signed)
Referring Provider: Titus Mould* Primary Care Physician:  Titus Mould, NP Primary GI:  Dr. Jena Gauss  Chief Complaint  Patient presents with  . Gastroesophageal Reflux    f/u. Doing okay. has problem with certai foods/drink. Wants to see if she can change the protonix to another medication    HPI:   Julie Mooney is a 62 y.o. female who presents for follow-up on GERD.  The patient was last seen in our office 06/23/2017 for GERD, abdominal pain, fluctuance.  Noted chronic history of GERD for multiple years.  She was previously established with GI in [place] but is since moved to [place].  EGD up-to-date November 2017 entirely normal.  Pancreatic protocol CT showed scattered lesions of the liver measuring up to 8 mm too small to characterize but felt to be a combination of cysts or hemangiomas.  3.5 cm left hepatic lobe lesion consistent with hemangioma.  Probable focal fat in the medial left hepatic lobe.  Mild intrahepatic biliary dilation but normal CBD.  Pancreas unremarkable.  She was previously on omeprazole and pantoprazole but was not consistently taking on a daily basis.  Failed Dexilant because of adverse effect where she feels it caused her to "forget things."  At her last visit she was doing better, she had recently gone gluten-free for a year and was able to stop her PPI.  After her mother passed in February of March 2019 she began eating "everything" and had recurrent symptoms.  Increased gas with certain foods like fried foods.  Started taking probiotics.  She restarted her PPI for a week in March and had some improvement.  No other GI symptoms.  Recommended continue PPI, ideally every day.  She is given patient education related to PPI side effects.  Attempt dairy free/lactose-free diet for 2 weeks to see if this helps her gas and if so would likely benefit from a lactose-free diet.  Follow-up in 2 months.  Today she states she's doing better. Her GERD  is doing ok unless she has dietary discretions. She doesn't like Protonix "because I used to take it before." She wants what "patients tell you is the best one." Denies abdominal pain, N/V, hematochezia, melena. Rare breakthrough GERD symptoms, typically if she drinks soda. Taking Protonix regularly but "might skip a day every now and then." Denies chest pain, dyspnea, dizziness, lightheadedness, syncope, near syncope. Denies any other upper or lower GI symptoms.  She asks me today if I "have any eye drop samples in the back you can give me."   Past Medical History:  Diagnosis Date  . Arthritis   . Asthma   . GERD (gastroesophageal reflux disease)   . MVP (mitral valve prolapse)   . Seasonal allergies     Past Surgical History:  Procedure Laterality Date  . ABDOMINAL HYSTERECTOMY    . COLONOSCOPY  08/2013   [facility]: tortuosity of sigm colon, moderately severe proctitis internal grade II hemorrhoids.   . ESOPHAGOGASTRODUODENOSCOPY N/A 01/14/2016   Procedure: ESOPHAGOGASTRODUODENOSCOPY (EGD);  Surgeon: Corbin Ade, MD;  Location: [facility];  Service: Endoscopy;  Laterality: N/A;  1:30 pm    Current Outpatient Medications  Medication Sig Dispense Refill  . albuterol (PROVENTIL HFA;VENTOLIN HFA) 108 (90 Base) MCG/ACT inhaler Inhale 2 puffs into the lungs every 6 (six) hours as needed for wheezing or shortness of breath.     Marland Kitchen aspirin EC 81 MG tablet Take 81 mg by mouth daily.    Marland Kitchen  Chlorphen-Pseudoephed-APAP (TYLENOL ALLERGY SINUS PO) Take 1 tablet by mouth daily as needed. For sinuses    . NON FORMULARY Vitamin D, zinc, calcium once a day    . olopatadine (PATANOL) 0.1 % ophthalmic solution Place 2 drops into both eyes as needed.     . pantoprazole (PROTONIX) 40 MG tablet Take 1 tablet (40 mg total) by mouth daily before breakfast. 90 tablet 3  . propranolol (INDERAL) 20 MG tablet Take 20 mg by mouth daily.    . Multiple Vitamin (MULTI-VITAMINS) TABS Take 1 tablet by  mouth daily.     No current facility-administered medications for this visit.     Allergies as of 09/10/2017 - Review Complete 09/10/2017  Allergen Reaction Noted  . Shrimp [shellfish allergy] Other (See Comments) 01/10/2016    Family History  Problem Relation Age of Onset  . Breast cancer Maternal Aunt 50  . Breast cancer Maternal Aunt   . Breast cancer Maternal Aunt   . Colon cancer Neg Hx     Social History   Socioeconomic History  . Marital status: Married    Spouse name: Not on file  . Number of children: Not on file  . Years of education: Not on file  . Highest education level: Not on file  Occupational History  . Not on file  Social Needs  . Financial resource strain: Not on file  . Food insecurity:    Worry: Not on file    Inability: Not on file  . Transportation needs:    Medical: Not on file    Non-medical: Not on file  Tobacco Use  . Smoking status: Never Smoker  . Smokeless tobacco: Never Used  Substance and Sexual Activity  . Alcohol use: No  . Drug use: Never  . Sexual activity: Not on file  Lifestyle  . Physical activity:    Days per week: Not on file    Minutes per session: Not on file  . Stress: Not on file  Relationships  . Social connections:    Talks on phone: Not on file    Gets together: Not on file    Attends religious service: Not on file    Active member of club or organization: Not on file    Attends meetings of clubs or organizations: Not on file    Relationship status: Not on file  Other Topics Concern  . Not on file  Social History Narrative   Retired - worked for school systems    Review of Systems: General: Negative for anorexia, weight loss, fever, chills, fatigue, weakness. ENT: Negative for hoarseness, difficulty swallowing , nasal congestion. CV: Negative for chest pain, angina, palpitations, dyspnea on exertion, peripheral edema.  Respiratory: Negative for dyspnea at rest, dyspnea on exertion, cough, sputum,  wheezing.  GI: See history of present illness. Endo: Negative for unusual weight change.  Heme: Negative for bruising or bleeding. Allergy: Negative for rash or hives.   Physical Exam: BP 126/80   Pulse 65   Temp (!) 97 F (36.1 C) (Oral)   Ht 5\' 3"  (1.6 m)   Wt 108 lb 6.4 oz (49.2 kg)   BMI 19.20 kg/m  General:   Alert and oriented. Pleasant and cooperative. Well-nourished and well-developed.  Eyes:  Without icterus, sclera clear and conjunctiva pink.  Ears:  Normal auditory acuity. Cardiovascular:  S1, S2 present without murmurs appreciated. Extremities without clubbing or edema. Respiratory:  Clear to auscultation bilaterally. No wheezes, rales, or rhonchi. No distress.  Gastrointestinal:  +  BS, soft, non-tender and non-distended. No HSM noted. No guarding or rebound. No masses appreciated.  Rectal:  Deferred  Musculoskalatal:  Symmetrical without gross deformities. Neurologic:  Alert and oriented x4;  grossly normal neurologically. Psych:  Alert and cooperative. Normal mood and affect. Heme/Lymph/Immune: No excessive bruising noted.    09/10/2017 9:22 AM   Disclaimer: This note was dictated with voice recognition software. Similar sounding words can inadvertently be transcribed and may not be corrected upon review.

## 2017-09-10 NOTE — Patient Instructions (Signed)
1. Continue taking Protonix every day. 2. Return for follow-up in 6 months. 3. Call us if you have any questions or concerns.  At [facility] we value your feedback. You may receive a survey about your visit today. Please share your experience as we strive to create trusting relationships with our patients to provide genuine, compassionate, quality care.  It was good to see you today!  I hope you have a great summer!!

## 2017-09-10 NOTE — Assessment & Plan Note (Signed)
Overall Protonix is working well for her.  She discussed other PPI options and combination PPI/H2 receptor blocker.  I discussed that given her medicine is working well there is no indication to change it and that if there was a change made it could cause worsening symptoms.  She did eventually agreed to stay on Protonix.  Follow-up in 6 months.

## 2017-09-10 NOTE — Progress Notes (Signed)
CC'ED TO PCP 

## 2017-09-10 NOTE — Assessment & Plan Note (Signed)
Abdominal pain in the epigastric region is improved.  Her GERD overall seems to be doing well.  She initially felt like she wants to change her PPI because "I taken it before."  I discussed that changing her medicine could cause worsening symptoms if the new medicine does not work as well.  It appears she is doing just fine on Protonix and I recommended she continue this.  She eventually agreed.  Follow-up in 6 months.

## 2017-12-25 ENCOUNTER — Encounter: Payer: Self-pay | Admitting: Internal Medicine

## 2018-03-01 ENCOUNTER — Other Ambulatory Visit: Payer: Self-pay | Admitting: Family Medicine

## 2018-03-01 DIAGNOSIS — Z1231 Encounter for screening mammogram for malignant neoplasm of breast: Secondary | ICD-10-CM

## 2018-03-16 ENCOUNTER — Ambulatory Visit: Payer: BLUE CROSS/BLUE SHIELD | Admitting: Nurse Practitioner

## 2018-03-19 ENCOUNTER — Ambulatory Visit
Admission: RE | Admit: 2018-03-19 | Discharge: 2018-03-19 | Disposition: A | Discharge status: Home / Self Care | Payer: BLUE CROSS/BLUE SHIELD | Source: Ambulatory Visit | Attending: Family Medicine | Admitting: Family Medicine

## 2018-03-19 DIAGNOSIS — Z1231 Encounter for screening mammogram for malignant neoplasm of breast: Secondary | ICD-10-CM

## 2018-03-23 ENCOUNTER — Encounter: Payer: Self-pay | Admitting: Nurse Practitioner

## 2018-03-23 ENCOUNTER — Ambulatory Visit: Payer: BLUE CROSS/BLUE SHIELD | Admitting: Nurse Practitioner

## 2018-03-23 VITALS — BP 110/68 | HR 72 | Temp 97.0°F | Ht 63.0 in | Wt 117.4 lb

## 2018-03-23 DIAGNOSIS — K219 Gastro-esophageal reflux disease without esophagitis: Secondary | ICD-10-CM

## 2018-03-23 DIAGNOSIS — R1013 Epigastric pain: Secondary | ICD-10-CM

## 2018-03-23 NOTE — Patient Instructions (Signed)
Your health issues we discussed today were:   Heartburn (GERD): 1. Continue taking Protonix daily. 2. Avoid foods that trigger worsening symptoms. 3. If you do have breakthrough symptoms due to eating something you should not, you can use Tums temporarily to help 4. Check with your primary care to ensure your bone density scan is up-to-date 5. Call us if you have any worsening symptoms  Overall I recommend:  1. Return for follow-up in 1 year 2. Call us if you have any questions or concerns  At [facility] we value your feedback. You may receive a survey about your visit today. Please share your experience as we strive to create trusting relationships with our patients to provide genuine, compassionate, quality care.  We appreciate your understanding and patience as we review any laboratory studies, imaging, and other diagnostic tests that are ordered as we care for you. Our office policy is 5 business days for review of these results, and any emergent or urgent results are addressed in a timely manner for your best interest. If you do not hear from our office in 1 week, please contact us.   We also encourage the use of MyChart, which contains your medical information for your review as well. If you are not enrolled in this feature, an access code is on this after visit summary for your convenience. Thank you for allowing us to be involved in your care.  It was great to see you today!  I hope you have a great day!!

## 2018-03-23 NOTE — Assessment & Plan Note (Signed)
History of GERD and currently well managed on Protonix 40 mg daily.  She has breakthrough symptoms only with specific dietary indiscretions.  We discussed avoiding triggers to help keep her symptoms at day.  She would like to eventually come off of a PPI as she does not like taking many medications.  We had a lengthy discussion about side effects and reports based on "good research" versus "bad research".  I did recommend that she check with her primary care and ensure her bone density scan is up-to-date given her age and PPI use.  Return for follow-up in 1 year.

## 2018-03-23 NOTE — Progress Notes (Signed)
cc'ed to pcp °

## 2018-03-23 NOTE — Assessment & Plan Note (Signed)
Symptoms generally well managed on PPI.  No breakthrough epigastric pain.  Recommend she continue her current medications including Protonix 40 mg daily.  Follow-up in 1 year.

## 2018-03-23 NOTE — Progress Notes (Signed)
Referring Provider: Titus MouldWhite, Elizabeth Burney* Primary Care Physician:  Titus MouldWhite, Elizabeth Burney, NP Primary GI:  Dr. Jena Gaussourk  Chief Complaint  Patient presents with  . Gastroesophageal Reflux    depends on what she eats will cause her to "burp acid"    HPI:   Julie Mooney is a 63 y.o. female who presents for follow-up on GERD and epigastric pain.  The patient was last seen in our office 09/10/2017 for the same.  Chronic history of GERD for multiple years.  EGD up-to-date November 2017 and entirely normal.  Previous adverse effect/allergy to Dexilant.  At her last visit her GERD was doing okay unless she has dietary indiscretions.  Does not like Protonix and requesting the PPI that "patients tell you is the best one."  She does take Protonix regularly but "may skip it every now and then."  No other GI symptoms.  Recommended she continue her current medications, follow-up in 6 months.  Today she states she's doing well overall. Only has "burping up acid" if she eats certain foods. Still on Protonix 40 mg daily. Typically offending foods include beans, cabbage, brussel sprouts, certain cheeses. Doesn't like taking Protonix. Had an extensive discussion on ADEs. Denies ongoing abdominal pain, N/V, fever, unintentional weight loss. Denies chest pain, dyspnea, dizziness, lightheadedness, syncope, near syncope. Denies any other upper or lower GI symptoms.  Past Medical History:  Diagnosis Date  . Arthritis   . Asthma   . GERD (gastroesophageal reflux disease)   . MVP (mitral valve prolapse)   . Seasonal allergies     Past Surgical History:  Procedure Laterality Date  . ABDOMINAL HYSTERECTOMY    . COLONOSCOPY  08/2013   [facility]: tortuosity of sigm colon, moderately severe proctitis internal grade II hemorrhoids.   . ESOPHAGOGASTRODUODENOSCOPY N/A 01/14/2016   Procedure: ESOPHAGOGASTRODUODENOSCOPY (EGD);  Surgeon: Corbin Adeobert M Rourk, MD;  Location: [facility];  Service: Endoscopy;   Laterality: N/A;  1:30 pm    Current Outpatient Medications  Medication Sig Dispense Refill  . albuterol (PROVENTIL HFA;VENTOLIN HFA) 108 (90 Base) MCG/ACT inhaler Inhale 2 puffs into the lungs every 6 (six) hours as needed for wheezing or shortness of breath.     Marland Kitchen. aspirin EC 81 MG tablet Take 81 mg by mouth daily.    . Chlorphen-Pseudoephed-APAP (TYLENOL ALLERGY SINUS PO) Take 1 tablet by mouth daily as needed. For sinuses    . NON FORMULARY Vitamin D, zinc, calcium once a day    . olopatadine (PATANOL) 0.1 % ophthalmic solution Place 2 drops into both eyes as needed.     . pantoprazole (PROTONIX) 40 MG tablet Take 1 tablet (40 mg total) by mouth daily before breakfast. 90 tablet 3  . propranolol (INDERAL) 20 MG tablet Take 20 mg by mouth daily.     No current facility-administered medications for this visit.     Allergies as of 03/23/2018 - Review Complete 03/23/2018  Allergen Reaction Noted  . Shrimp [shellfish allergy] Other (See Comments) 01/10/2016    Family History  Problem Relation Age of Onset  . Breast cancer Maternal Aunt 50  . Breast cancer Maternal Aunt   . Breast cancer Maternal Aunt   . Colon cancer Neg Hx     Social History   Socioeconomic History  . Marital status: Married    Spouse name: Not on file  . Number of children: Not on file  . Years of education: Not on file  . Highest education level: Not on  file  Occupational History  . Not on file  Social Needs  . Financial resource strain: Not on file  . Food insecurity:    Worry: Not on file    Inability: Not on file  . Transportation needs:    Medical: Not on file    Non-medical: Not on file  Tobacco Use  . Smoking status: Never Smoker  . Smokeless tobacco: Never Used  Substance and Sexual Activity  . Alcohol use: No  . Drug use: Never  . Sexual activity: Not on file  Lifestyle  . Physical activity:    Days per week: Not on file    Minutes per session: Not on file  . Stress: Not on file    Relationships  . Social connections:    Talks on phone: Not on file    Gets together: Not on file    Attends religious service: Not on file    Active member of club or organization: Not on file    Attends meetings of clubs or organizations: Not on file    Relationship status: Not on file  Other Topics Concern  . Not on file  Social History Narrative   Retired - worked for school systems    Review of Systems: General: Negative for anorexia, weight loss, fever, chills, fatigue, weakness. ENT: Negative for hoarseness, difficulty swallowing. CV: Negative for chest pain, angina, palpitations, peripheral edema.  Respiratory: Negative for dyspnea at rest, cough, sputum, wheezing.  GI: See history of present illness. Endo: Negative for unusual weight change.  Heme: Negative for bruising or bleeding. Allergy: Negative for rash or hives.   Physical Exam: BP 110/68   Pulse 72   Temp (!) 97 F (36.1 C) (Oral)   Ht 5\' 3"  (1.6 m)   Wt 117 lb 6.4 oz (53.3 kg)   BMI 20.80 kg/m  General:   Alert and oriented. Pleasant and cooperative. Well-nourished and well-developed. Eyes:  Without icterus, sclera clear and conjunctiva pink.  Ears:  Normal auditory acuity. Cardiovascular:  S1, S2 present without murmurs appreciated. Extremities without clubbing or edema. Respiratory:  Clear to auscultation bilaterally. No wheezes, rales, or rhonchi. No distress.  Gastrointestinal:  +BS, soft, non-tender and non-distended. No HSM noted. No guarding or rebound. No masses appreciated.  Rectal:  Deferred  Musculoskalatal:  Symmetrical without gross deformities. Neurologic:  Alert and oriented x4;  grossly normal neurologically. Psych:  Alert and cooperative. Normal mood and affect. Heme/Lymph/Immune: No excessive bruising noted.    03/23/2018 2:49 PM   Disclaimer: This note was dictated with voice recognition software. Similar sounding words can inadvertently be transcribed and may not be corrected  upon review.

## 2018-07-08 ENCOUNTER — Telehealth: Payer: Self-pay | Admitting: *Deleted

## 2018-07-08 NOTE — Telephone Encounter (Signed)
Routed to provider. Called pt to get more info. Lmom, waiting on a return call.

## 2018-07-08 NOTE — Telephone Encounter (Signed)
Pt called back, she had been off of her Protonix for 1 month and started back 4 days ago. Pt burps, no burning sensations, and is able to tell that she is having a flare. PT mentioned being constipated as well. She took used a suppository and feels it didn't work. Pt took gaviscon last night and she felt better. Pt also increased her water this morning.

## 2018-07-08 NOTE — Telephone Encounter (Signed)
Pt stopped taking Protonix for awhile and started again 4 days ago.  Stomach pain and having a lot of gas.  She is also burping a lot.  Pt feels better when she eats.  [phone]

## 2018-07-15 NOTE — Telephone Encounter (Signed)
If she's having symptoms despite daily PPI she can increase it to bid for a month. Alternatively, she can also use OTC TUMS as needed.  Call if this doesn't help

## 2018-07-19 DIAGNOSIS — Z1211 Encounter for screening for malignant neoplasm of colon: Secondary | ICD-10-CM | POA: Diagnosis not present

## 2018-07-20 NOTE — Telephone Encounter (Signed)
Left a detailed message. Waiting on a return call.  

## 2018-09-27 DIAGNOSIS — I341 Nonrheumatic mitral (valve) prolapse: Secondary | ICD-10-CM | POA: Diagnosis not present

## 2018-09-27 DIAGNOSIS — J310 Chronic rhinitis: Secondary | ICD-10-CM | POA: Diagnosis not present

## 2018-09-27 DIAGNOSIS — Z0000 Encounter for general adult medical examination without abnormal findings: Secondary | ICD-10-CM | POA: Diagnosis not present

## 2018-09-27 DIAGNOSIS — J4520 Mild intermittent asthma, uncomplicated: Secondary | ICD-10-CM | POA: Diagnosis not present

## 2018-10-08 DIAGNOSIS — R252 Cramp and spasm: Secondary | ICD-10-CM | POA: Diagnosis not present

## 2018-11-09 DIAGNOSIS — M1812 Unilateral primary osteoarthritis of first carpometacarpal joint, left hand: Secondary | ICD-10-CM | POA: Diagnosis not present

## 2018-11-18 DIAGNOSIS — M79645 Pain in left finger(s): Secondary | ICD-10-CM | POA: Diagnosis not present

## 2018-11-18 DIAGNOSIS — M1812 Unilateral primary osteoarthritis of first carpometacarpal joint, left hand: Secondary | ICD-10-CM | POA: Diagnosis not present

## 2018-12-23 ENCOUNTER — Other Ambulatory Visit: Payer: Self-pay | Admitting: *Deleted

## 2018-12-23 DIAGNOSIS — Z20822 Contact with and (suspected) exposure to covid-19: Secondary | ICD-10-CM

## 2018-12-25 LAB — NOVEL CORONAVIRUS, NAA: SARS-CoV-2, NAA: NOT DETECTED

## 2018-12-30 ENCOUNTER — Telehealth: Payer: Self-pay | Admitting: General Practice

## 2018-12-30 NOTE — Telephone Encounter (Signed)
Negative COVID results given. Patient results "NOT Detected." Caller expressed understanding. ° °

## 2019-01-05 DIAGNOSIS — Z23 Encounter for immunization: Secondary | ICD-10-CM | POA: Diagnosis not present

## 2019-02-16 ENCOUNTER — Encounter: Payer: Self-pay | Admitting: Internal Medicine

## 2019-03-02 DIAGNOSIS — U071 COVID-19: Secondary | ICD-10-CM | POA: Diagnosis not present

## 2019-03-02 DIAGNOSIS — Z03818 Encounter for observation for suspected exposure to other biological agents ruled out: Secondary | ICD-10-CM | POA: Diagnosis not present

## 2019-04-19 DIAGNOSIS — N390 Urinary tract infection, site not specified: Secondary | ICD-10-CM | POA: Diagnosis not present

## 2019-04-25 ENCOUNTER — Other Ambulatory Visit: Payer: Self-pay | Admitting: Family Medicine

## 2019-05-19 ENCOUNTER — Other Ambulatory Visit: Payer: Self-pay | Admitting: Family Medicine

## 2019-05-19 DIAGNOSIS — Z1231 Encounter for screening mammogram for malignant neoplasm of breast: Secondary | ICD-10-CM

## 2019-06-02 ENCOUNTER — Ambulatory Visit
Admission: RE | Admit: 2019-06-02 | Discharge: 2019-06-02 | Disposition: A | Discharge status: Home / Self Care | Payer: BLUE CROSS/BLUE SHIELD | Source: Ambulatory Visit | Attending: Family Medicine | Admitting: Family Medicine

## 2019-06-02 DIAGNOSIS — Z1231 Encounter for screening mammogram for malignant neoplasm of breast: Secondary | ICD-10-CM | POA: Diagnosis not present

## 2019-06-21 DIAGNOSIS — M79644 Pain in right finger(s): Secondary | ICD-10-CM | POA: Diagnosis not present

## 2019-06-21 DIAGNOSIS — G5602 Carpal tunnel syndrome, left upper limb: Secondary | ICD-10-CM | POA: Insufficient documentation

## 2019-06-28 DIAGNOSIS — E559 Vitamin D deficiency, unspecified: Secondary | ICD-10-CM | POA: Diagnosis not present

## 2019-06-28 DIAGNOSIS — Z0000 Encounter for general adult medical examination without abnormal findings: Secondary | ICD-10-CM | POA: Diagnosis not present

## 2019-06-28 DIAGNOSIS — I341 Nonrheumatic mitral (valve) prolapse: Secondary | ICD-10-CM | POA: Diagnosis not present

## 2019-06-28 DIAGNOSIS — Z9109 Other allergy status, other than to drugs and biological substances: Secondary | ICD-10-CM | POA: Diagnosis not present

## 2019-06-28 DIAGNOSIS — Z79899 Other long term (current) drug therapy: Secondary | ICD-10-CM | POA: Diagnosis not present

## 2019-06-28 DIAGNOSIS — Z8679 Personal history of other diseases of the circulatory system: Secondary | ICD-10-CM | POA: Diagnosis not present

## 2019-06-28 DIAGNOSIS — M79641 Pain in right hand: Secondary | ICD-10-CM | POA: Insufficient documentation

## 2019-06-28 DIAGNOSIS — Z130 Encounter for screening for diseases of the blood and blood-forming organs and certain disorders involving the immune mechanism: Secondary | ICD-10-CM | POA: Diagnosis not present

## 2019-06-28 DIAGNOSIS — J4520 Mild intermittent asthma, uncomplicated: Secondary | ICD-10-CM | POA: Diagnosis not present

## 2019-06-28 DIAGNOSIS — Z13220 Encounter for screening for lipoid disorders: Secondary | ICD-10-CM | POA: Diagnosis not present

## 2019-07-05 DIAGNOSIS — I10 Essential (primary) hypertension: Secondary | ICD-10-CM | POA: Diagnosis not present

## 2019-07-05 DIAGNOSIS — G459 Transient cerebral ischemic attack, unspecified: Secondary | ICD-10-CM | POA: Diagnosis not present

## 2019-07-05 DIAGNOSIS — I341 Nonrheumatic mitral (valve) prolapse: Secondary | ICD-10-CM | POA: Diagnosis not present

## 2019-07-12 DIAGNOSIS — M79641 Pain in right hand: Secondary | ICD-10-CM | POA: Diagnosis not present

## 2019-08-09 DIAGNOSIS — I341 Nonrheumatic mitral (valve) prolapse: Secondary | ICD-10-CM | POA: Diagnosis not present

## 2019-08-16 DIAGNOSIS — G459 Transient cerebral ischemic attack, unspecified: Secondary | ICD-10-CM | POA: Diagnosis not present

## 2019-08-16 DIAGNOSIS — I341 Nonrheumatic mitral (valve) prolapse: Secondary | ICD-10-CM | POA: Diagnosis not present

## 2019-08-23 DIAGNOSIS — M79641 Pain in right hand: Secondary | ICD-10-CM | POA: Diagnosis not present

## 2019-09-02 DIAGNOSIS — R202 Paresthesia of skin: Secondary | ICD-10-CM | POA: Diagnosis not present

## 2019-09-06 DIAGNOSIS — M79641 Pain in right hand: Secondary | ICD-10-CM | POA: Diagnosis not present

## 2019-11-01 DIAGNOSIS — H47292 Other optic atrophy, left eye: Secondary | ICD-10-CM | POA: Diagnosis not present

## 2019-11-01 DIAGNOSIS — H43813 Vitreous degeneration, bilateral: Secondary | ICD-10-CM | POA: Diagnosis not present

## 2019-11-23 ENCOUNTER — Encounter: Payer: Self-pay | Admitting: Gastroenterology

## 2019-11-23 ENCOUNTER — Other Ambulatory Visit: Payer: Self-pay

## 2019-11-23 ENCOUNTER — Telehealth (INDEPENDENT_AMBULATORY_CARE_PROVIDER_SITE_OTHER): Payer: BLUE CROSS/BLUE SHIELD | Admitting: Gastroenterology

## 2019-11-23 ENCOUNTER — Telehealth: Payer: Self-pay | Admitting: *Deleted

## 2019-11-23 ENCOUNTER — Encounter: Payer: Self-pay | Admitting: Internal Medicine

## 2019-11-23 DIAGNOSIS — K5900 Constipation, unspecified: Secondary | ICD-10-CM

## 2019-11-23 DIAGNOSIS — K219 Gastro-esophageal reflux disease without esophagitis: Secondary | ICD-10-CM | POA: Diagnosis not present

## 2019-11-23 NOTE — Telephone Encounter (Signed)
Pt consented to a virtual visit. 

## 2019-11-23 NOTE — Progress Notes (Signed)
Primary Care Physician:  Titus Mould, NP  Primary GI: Dr. Jena Gauss   Patient Location: Home   Provider Location: [facility] office   Reason for Visit: Follow-up    Persons present on the virtual encounter, with roles: Patient and NP   Total time (minutes) spent on medical discussion: 10 minutes   Due to COVID-19, visit was conducted using virtual method.  Visit was requested by patient.  Virtual Visit via MyChart Video Note Due to COVID-19, visit is conducted virtually and was requested by patient.   I connected with Bobbe Medico on 11/23/19 at  2:30 PM EDT by telephone and verified that I am speaking with the correct person using two identifiers.   I discussed the limitations, risks, security and privacy concerns of performing an evaluation and management service by telephone and the availability of in person appointments. I also discussed with the patient that there may be a patient responsible charge related to this service. The patient expressed understanding and agreed to proceed.  Chief Complaint  Patient presents with  . Constipation    f/u. Has very little BM "almost everyday"  . Bloated    had episode 2 days ago     History of Present Illness: 64 year old female presenting today via video visit for constipation. Chronic history of GERD, s/p EGD 2017. Last colonoscopy at outside facility in 2015.   Dealing with constipation moreso over the past year. Started drinking Ensure for past year as well. Started using dulcolax. Scared to take a lot of medications. Takes 3 times a week. Having a BM almost daily but broken up. Hard stool. No rectal bleeding. Occasional bloating. Takes a probiotic daily. No supplemental fiber. Denies any unexplained weight loss. Had COVID in Jan 2021 and states she has been anxious since then. Dealing with stress. PCP aware.   Protonix once a day for GERD, which is controlling overall.       Past Medical History:  Diagnosis Date  .  Arthritis   . Asthma   . GERD (gastroesophageal reflux disease)   . MVP (mitral valve prolapse)   . Seasonal allergies      Past Surgical History:  Procedure Laterality Date  . ABDOMINAL HYSTERECTOMY    . COLONOSCOPY  08/2013   [facility]: tortuosity of sigm colon, moderately severe proctitis internal grade II hemorrhoids.   . ESOPHAGOGASTRODUODENOSCOPY N/A 01/14/2016   Normal      Current Meds  Medication Sig  . albuterol (PROVENTIL HFA;VENTOLIN HFA) 108 (90 Base) MCG/ACT inhaler Inhale 2 puffs into the lungs every 6 (six) hours as needed for wheezing or shortness of breath.   Marland Kitchen aspirin EC 81 MG tablet Take 81 mg by mouth daily.  . Chlorphen-Pseudoephed-APAP (TYLENOL ALLERGY SINUS PO) Take 1 tablet by mouth daily as needed. For sinuses  . Ensure (ENSURE) Take 237 mLs by mouth daily.  . NON FORMULARY Vitamin D, zinc, calcium once a day  . olopatadine (PATANOL) 0.1 % ophthalmic solution Place 2 drops into both eyes as needed.   . pantoprazole (PROTONIX) 40 MG tablet Take 1 tablet (40 mg total) by mouth daily before breakfast.  . propranolol (INDERAL) 20 MG tablet Take 20 mg by mouth daily.     Family History  Problem Relation Age of Onset  . Breast cancer Maternal Aunt 50  . Breast cancer Maternal Aunt   . Breast cancer Maternal Aunt   . Colon cancer Neg Hx     Social History  Socioeconomic History  . Marital status: Married    Spouse name: Not on file  . Number of children: Not on file  . Years of education: Not on file  . Highest education level: Not on file  Occupational History  . Not on file  Tobacco Use  . Smoking status: Never Smoker  . Smokeless tobacco: Never Used  Substance and Sexual Activity  . Alcohol use: No  . Drug use: Never  . Sexual activity: Not on file  Other Topics Concern  . Not on file  Social History Narrative   Retired - worked for school systems   Social Determinants of Corporate investment banker Strain:   . Difficulty  of Paying Living Expenses: Not on file  Food Insecurity:   . Worried About Programme researcher, broadcasting/film/video in the Last Year: Not on file  . Ran Out of Food in the Last Year: Not on file  Transportation Needs:   . Lack of Transportation (Medical): Not on file  . Lack of Transportation (Non-Medical): Not on file  Physical Activity:   . Days of Exercise per Week: Not on file  . Minutes of Exercise per Session: Not on file  Stress:   . Feeling of Stress : Not on file  Social Connections:   . Frequency of Communication with Friends and Family: Not on file  . Frequency of Social Gatherings with Friends and Family: Not on file  . Attends Religious Services: Not on file  . Active Member of Clubs or Organizations: Not on file  . Attends Banker Meetings: Not on file  . Marital Status: Not on file       Review of Systems: Gen: Denies fever, chills, anorexia. Denies fatigue, weakness, weight loss.  CV: Denies chest pain, palpitations, syncope, peripheral edema, and claudication. Resp: Denies dyspnea at rest, cough, wheezing, coughing up blood, and pleurisy. GI: see HPI Derm: Denies rash, itching, dry skin Psych: Denies depression, anxiety, memory loss, confusion. No homicidal or suicidal ideation.  Heme: Denies bruising, bleeding, and enlarged lymph nodes.  Observations/Objective: No distress. Unable to perform physical exam due to video encounter. Dressed appropriately, maintains eye contact, pleasant affect.   Assessment and Plan: 64 year old female with constipation for the past year, without alarm signs/symptoms. She is hesitant to use any prescriptive agents. Will trial addition of Benefiber first. May need to trial Linzess. She is to call with an update in the next few weeks.  GERD remains well-managed on Protonix once daily. No alarm signs/symptoms.  Return in 3-4 months.   Follow Up Instructions:    I discussed the assessment and treatment plan with the patient. The patient  was provided an opportunity to ask questions and all were answered. The patient agreed with the plan and demonstrated an understanding of the instructions.   The patient was advised to call back or seek an in-person evaluation if the symptoms worsen or if the condition fails to improve as anticipated.  I provided 10 minutes of non face-to-face time during this video encounter.  Gelene Mink, PhD, ANP-BC [facility]

## 2019-11-23 NOTE — Patient Instructions (Signed)
Start taking Benefiber 2 teaspoons each morning in your beverage of choice. Drink at least 60 ounces of water a day, keeping urine light yellow. Please call if no improvement with this, and we will try a prescription.  We will see you in 3-4 months!  It was a pleasure to see you today. I want to create trusting relationships with patients to provide genuine, compassionate, and quality care. I value your feedback. If you receive a survey regarding your visit,  I greatly appreciate you taking time to fill this out.   Gelene Mink, PhD, ANP-BC [facility]

## 2019-11-23 NOTE — Progress Notes (Signed)
Cc'ed to pcp °

## 2019-11-23 NOTE — Telephone Encounter (Signed)
Bobbe Medico, you are scheduled for a virtual visit with your provider today.  Just as we do with appointments in the office, we must obtain your consent to participate.  Your consent will be active for this visit and any virtual visit you may have with one of our providers in the next 365 days.  If you have a MyChart account, I can also send a copy of this consent to you electronically.  All virtual visits are billed to your insurance company just like a traditional visit in the office.  As this is a virtual visit, video technology does not allow for your provider to perform a traditional examination.  This may limit your provider's ability to fully assess your condition.  If your provider identifies any concerns that need to be evaluated in person or the need to arrange testing such as labs, EKG, etc, we will make arrangements to do so.  Although advances in technology are sophisticated, we cannot ensure that it will always work on either your end or our end.  If the connection with a video visit is poor, we may have to switch to a telephone visit.  With either a video or telephone visit, we are not always able to ensure that we have a secure connection.   I need to obtain your verbal consent now.   Are you willing to proceed with your visit today?

## 2020-01-05 DIAGNOSIS — Z23 Encounter for immunization: Secondary | ICD-10-CM | POA: Diagnosis not present

## 2020-01-05 DIAGNOSIS — M545 Low back pain, unspecified: Secondary | ICD-10-CM | POA: Diagnosis not present

## 2020-01-05 DIAGNOSIS — M79605 Pain in left leg: Secondary | ICD-10-CM | POA: Diagnosis not present

## 2020-02-22 ENCOUNTER — Ambulatory Visit: Payer: BLUE CROSS/BLUE SHIELD | Admitting: Gastroenterology

## 2020-04-04 ENCOUNTER — Other Ambulatory Visit: Payer: Self-pay

## 2020-04-04 ENCOUNTER — Encounter: Payer: Self-pay | Admitting: Gastroenterology

## 2020-04-04 ENCOUNTER — Ambulatory Visit: Payer: BLUE CROSS/BLUE SHIELD | Admitting: Gastroenterology

## 2020-04-04 VITALS — BP 115/83 | HR 73 | Temp 97.1°F | Ht 63.0 in | Wt 110.6 lb

## 2020-04-04 DIAGNOSIS — K219 Gastro-esophageal reflux disease without esophagitis: Secondary | ICD-10-CM

## 2020-04-04 DIAGNOSIS — K5900 Constipation, unspecified: Secondary | ICD-10-CM

## 2020-04-04 DIAGNOSIS — R143 Flatulence: Secondary | ICD-10-CM

## 2020-04-04 NOTE — Patient Instructions (Addendum)
1. For constipation: Take fiber on a daily basis.  As discussed today, since you prefer to avoid powder medications, you can try a fiber gummy or chewable tablet.  Take enough to get 3 to 4 g of fiber per day.  Fiberchoice makes a good chewable tablet, you would need to chew two daily.  Benefiber as well as other brands make fiber Gummies.  2. Continue to drink at least 60 ounces of noncaffeinated beverages daily. 3. Try to get plenty of fiber in your diet. 4. You can use Beano or Gas-X for excessive gas and bloating.  Take as directed on package labeling. 5. Continue pantoprazole daily for reflux. 6. We will see you back in 1 year.  If you have any questions or concerns, please call sooner. 7. If you have any change in bowels, blood in the stool, abdominal pain, anemia, we would consider colonoscopy sooner.  As of right now you are due in 2025.   High-Fiber Eating Plan Fiber, also called dietary fiber, is a type of carbohydrate. It is found foods such as fruits, vegetables, whole grains, and beans. A high-fiber diet can have many health benefits. Your health care provider may recommend a high-fiber diet to help:  Prevent constipation. Fiber can make your bowel movements more regular.  Lower your cholesterol.  Relieve the following conditions: ? Inflammation of veins in the anus (hemorrhoids). ? Inflammation of specific areas of the digestive tract (uncomplicated diverticulosis). ? A problem of the large intestine, also called the colon, that sometimes causes pain and diarrhea (irritable bowel syndrome, or IBS).  Prevent overeating as part of a weight-loss plan.  Prevent heart disease, type 2 diabetes, and certain cancers. What are tips for following this plan? Reading food labels  Check the nutrition facts label on food products for the amount of dietary fiber. Choose foods that have 5 grams of fiber or more per serving.  The goals for recommended daily fiber intake include: ? Men (age  27 or younger): 34-38 g. ? Men (over age 30): 28-34 g. ? Women (age 82 or younger): 25-28 g. ? Women (over age 71): 22-25 g. Your daily fiber goal is _____________ g.   Shopping  Choose whole fruits and vegetables instead of processed forms, such as apple juice or applesauce.  Choose a wide variety of high-fiber foods such as avocados, lentils, oats, and kidney beans.  Read the nutrition facts label of the foods you choose. Be aware of foods with added fiber. These foods often have high sugar and sodium amounts per serving. Cooking  Use whole-grain flour for baking and cooking.  Cook with brown rice instead of white rice. Meal planning  Start the day with a breakfast that is high in fiber, such as a cereal that contains 5 g of fiber or more per serving.  Eat breads and cereals that are made with whole-grain flour instead of refined flour or white flour.  Eat brown rice, bulgur wheat, or millet instead of white rice.  Use beans in place of meat in soups, salads, and pasta dishes.  Be sure that half of the grains you eat each day are whole grains. General information  You can get the recommended daily intake of dietary fiber by: ? Eating a variety of fruits, vegetables, grains, nuts, and beans. ? Taking a fiber supplement if you are not able to take in enough fiber in your diet. It is better to get fiber through food than from a supplement.  Gradually increase  how much fiber you consume. If you increase your intake of dietary fiber too quickly, you may have bloating, cramping, or gas.  Drink plenty of water to help you digest fiber.  Choose high-fiber snacks, such as berries, raw vegetables, nuts, and popcorn. What foods should I eat? Fruits Berries. Pears. Apples. Oranges. Avocado. Prunes and raisins. Dried figs. Vegetables Sweet potatoes. Spinach. Kale. Artichokes. Cabbage. Broccoli. Cauliflower. Green peas. Carrots. Squash. Grains Whole-grain breads. Multigrain cereal.  Oats and oatmeal. Brown rice. Barley. Bulgur wheat. Millet. Quinoa. Bran muffins. Popcorn. Rye wafer crackers. Meats and other proteins Navy beans, kidney beans, and pinto beans. Soybeans. Split peas. Lentils. Nuts and seeds. Dairy Fiber-fortified yogurt. Beverages Fiber-fortified soy milk. Fiber-fortified orange juice. Other foods Fiber bars. The items listed above may not be a complete list of recommended foods and beverages. Contact a dietitian for more information. What foods should I avoid? Fruits Fruit juice. Cooked, strained fruit. Vegetables Fried potatoes. Canned vegetables. Well-cooked vegetables. Grains White bread. Pasta made with refined flour. White rice. Meats and other proteins Fatty cuts of meat. Fried chicken or fried fish. Dairy Milk. Yogurt. Cream cheese. Sour cream. Fats and oils Butters. Beverages Soft drinks. Other foods Cakes and pastries. The items listed above may not be a complete list of foods and beverages to avoid. Talk with your dietitian about what choices are best for you. Summary  Fiber is a type of carbohydrate. It is found in foods such as fruits, vegetables, whole grains, and beans.  A high-fiber diet has many benefits. It can help to prevent constipation, lower blood cholesterol, aid weight loss, and reduce your risk of heart disease, diabetes, and certain cancers.  Increase your intake of fiber gradually. Increasing fiber too quickly may cause cramping, bloating, and gas. Drink plenty of water while you increase the amount of fiber you consume.  The best sources of fiber include whole fruits and vegetables, whole grains, nuts, seeds, and beans. This information is not intended to replace advice given to you by your health care provider. Make sure you discuss any questions you have with your health care provider. Document Revised: 06/23/2019 Document Reviewed: 06/23/2019 Elsevier Patient Education  2021 [employer].

## 2020-04-04 NOTE — Progress Notes (Signed)
Primary Care Physician: Titus Mould, NP  Primary Gastroenterologist:  Roetta Sessions, MD   Chief Complaint  Patient presents with  . Constipation    BM's almost daily, straining, hard BM's, no bleeding, takes sennakot when she remembers    HPI: Julie Mooney is a 65 y.o. female here for follow-up. She had a virtual visit back in September. History of chronic GERD and constipation.   Constipation for a couple of years.  Seem to occur after retiring.  Feels like having Covid a year ago also affected her bowel function.  She has been hesitant to use any prescriptive agents. She was advised to take Benefiber 2 teaspoons daily after last office visit.  States the [place] does work when she takes it, she tends to alternate between [facility] and Senokot.  She prefers pills instead of powder.  Does not like MiraLAX.  Prefers to avoid prescriptions.  Currently her bowel movements occur most days but she does have hard stools and strains at times.  Denies any blood in the stool.  No abdominal pain.  Appetite is normal.  No nausea or vomiting.  Heartburn well controlled.  She does have extra gas and flatulence with certain foods, does not tolerate beans but would like to eat on occasion.  Inquires about use of Beano along with pantoprazole.  Continues to drink Ensure daily.  She would like to gain some weight, goal of 120 pounds.  Weight has been stable around 110.   Current Outpatient Medications  Medication Sig Dispense Refill  . albuterol (PROVENTIL HFA;VENTOLIN HFA) 108 (90 Base) MCG/ACT inhaler Inhale 2 puffs into the lungs every 6 (six) hours as needed for wheezing or shortness of breath.     Marland Kitchen aspirin EC 81 MG tablet Take 81 mg by mouth daily.    . Chlorphen-Pseudoephed-APAP (TYLENOL ALLERGY SINUS PO) Take 1 tablet by mouth daily as needed. For sinuses    . Ensure (ENSURE) Take 237 mLs by mouth daily.    Marland Kitchen ipratropium (ATROVENT) 0.03 % nasal spray Place into the nose as needed.     . NON FORMULARY Vitamin D, zinc, calcium once a day    . olopatadine (PATANOL) 0.1 % ophthalmic solution Place 2 drops into both eyes as needed.     . pantoprazole (PROTONIX) 40 MG tablet Take 1 tablet (40 mg total) by mouth daily before breakfast. 90 tablet 3  . propranolol (INDERAL) 20 MG tablet Take 20 mg by mouth daily.    . sennosides-docusate sodium (SENOKOT-S) 8.6-50 MG tablet Take 1 tablet by mouth daily. As needed     No current facility-administered medications for this visit.    Allergies as of 04/04/2020 - Review Complete 04/04/2020  Allergen Reaction Noted  . Shrimp [shellfish allergy] Other (See Comments) 01/10/2016    ROS:  General: Negative for anorexia, weight loss, fever, chills, fatigue, weakness. ENT: Negative for hoarseness, difficulty swallowing , nasal congestion. CV: Negative for chest pain, angina, palpitations, dyspnea on exertion, peripheral edema.  Respiratory: Negative for dyspnea at rest, dyspnea on exertion, cough, sputum, wheezing.  GI: See history of present illness. GU:  Negative for dysuria, hematuria, urinary incontinence, urinary frequency, nocturnal urination.  Endo: Negative for unusual weight change.    Physical Examination:   BP 115/83   Pulse 73   Temp (!) 97.1 F (36.2 C)   Ht 5\' 3"  (1.6 m)   Wt 110 lb 9.6 oz (50.2 kg)   BMI 19.59 kg/m   General:  Well-nourished, well-developed in no acute distress.  Eyes: No icterus. Mouth:masked Lungs: Clear to auscultation bilaterally.  Heart: Regular rate and rhythm, no murmurs rubs or gallops.  Abdomen: Bowel sounds are normal, nontender, nondistended, no hepatosplenomegaly or masses, no abdominal bruits or hernia , no rebound or guarding.   Extremities: No lower extremity edema. No clubbing or deformities. Neuro: Alert and oriented x 4   Skin: Warm and dry, no jaundice.   Psych: Alert and cooperative, normal mood and affect.   Impression/plan:  65 year old female with chronic GERD,  constipation here for follow-up.    Constipation: She notes significant improvement of constipation if she takes fiber regularly or increase his dietary fiber.  She prefers to avoid powdered medication, preferring pill option.  Does use Senokot at times but not daily.  Prefers to avoid prescription agents, likes more natural approach.  No alarm symptoms.  Last colonoscopy in 2015.  No family history of colon cancer.  No personal history of colon polyps.  We will try fiber chewables or Gummies, goal of 3 to 4 g daily and a supplement.  Increase dietary fiber.  Drink adequate fluid, at least 60 ounces noncaffeinated fluid daily.  If she has any significant change in bowels, anemia, blood in the stool, abdominal pain we would recommend colonoscopy.   GERD: doing well. Continue pantoprazole daily.  Return to the office in one year or sooner if needed.

## 2020-04-05 ENCOUNTER — Encounter: Payer: Self-pay | Admitting: Gastroenterology

## 2020-04-05 ENCOUNTER — Telehealth: Payer: Self-pay | Admitting: Gastroenterology

## 2020-04-05 NOTE — Telephone Encounter (Signed)
Please nic for colonoscopy 03/2023.

## 2020-04-06 NOTE — Telephone Encounter (Signed)
On recall  °

## 2020-06-12 ENCOUNTER — Other Ambulatory Visit: Payer: Self-pay

## 2020-06-12 DIAGNOSIS — Z1231 Encounter for screening mammogram for malignant neoplasm of breast: Secondary | ICD-10-CM

## 2020-06-28 ENCOUNTER — Ambulatory Visit
Admission: RE | Admit: 2020-06-28 | Discharge: 2020-06-28 | Disposition: A | Discharge status: Home / Self Care | Payer: Self-pay | Source: Ambulatory Visit

## 2020-06-28 ENCOUNTER — Other Ambulatory Visit: Payer: Self-pay

## 2020-06-28 DIAGNOSIS — Z1231 Encounter for screening mammogram for malignant neoplasm of breast: Secondary | ICD-10-CM | POA: Insufficient documentation

## 2021-05-12 IMAGING — MG MM DIGITAL SCREENING BILAT W/ TOMO AND CAD
8 series · 9 of 24 positions shown · non-contrast
Comparison: Previous exam(s).

CLINICAL DATA: Screening.

EXAM:
DIGITAL SCREENING BILATERAL MAMMOGRAM WITH TOMOSYNTHESIS AND CAD
TECHNIQUE: Bilateral screening digital craniocaudal and mediolateral oblique
mammograms were obtained. Bilateral screening digital breast
tomosynthesis was performed. The images were evaluated with
computer-aided detection.

[L MLO synth-2D]
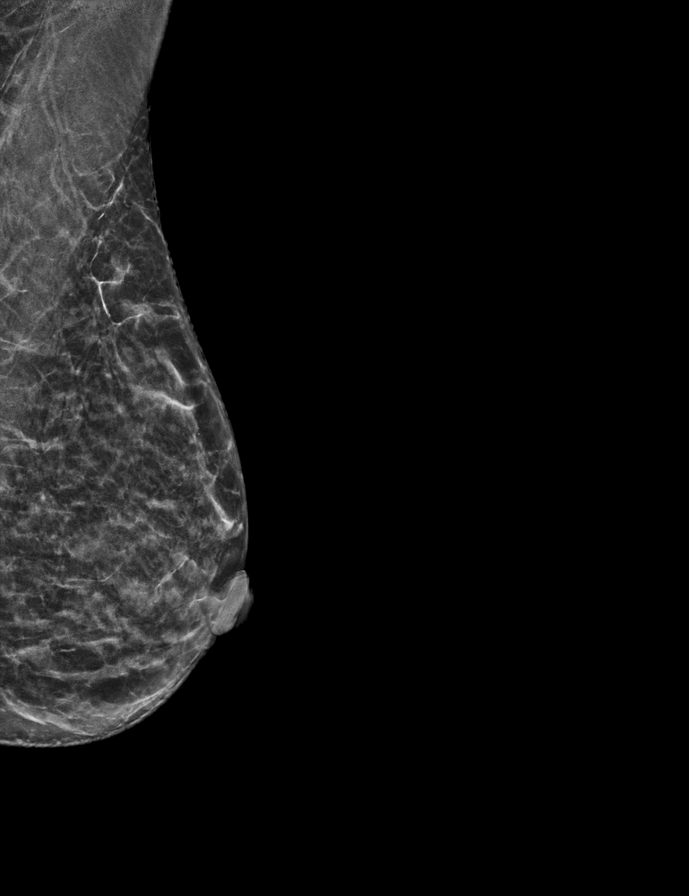

[L CC synth-2D]
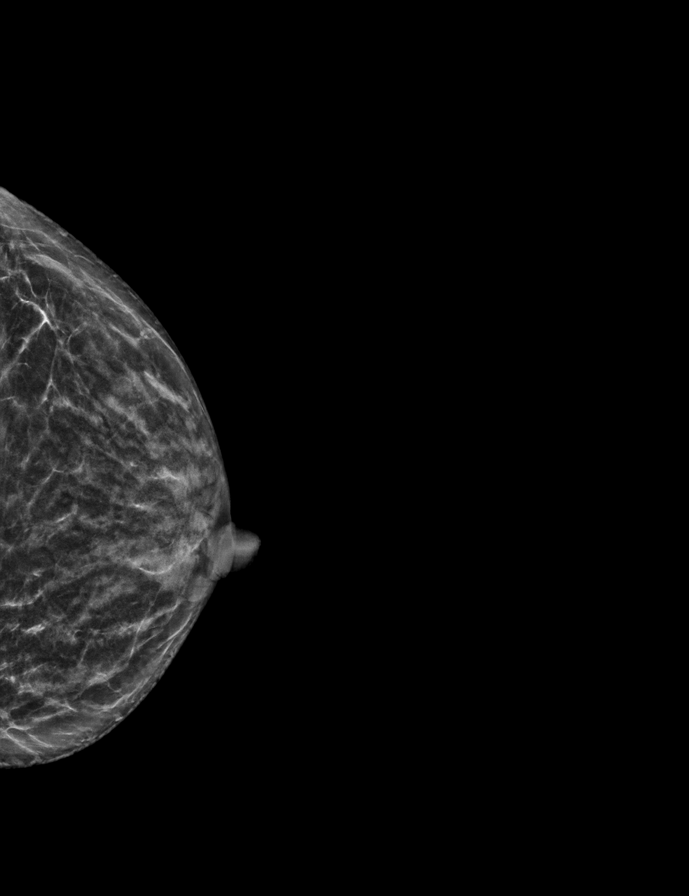

[R CC synth-2D]
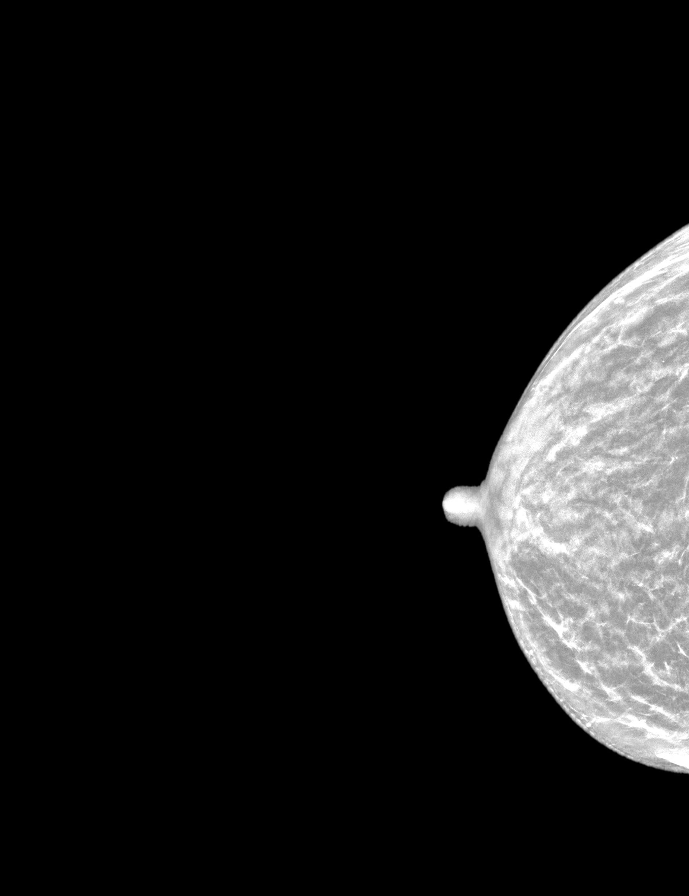

[R MLO synth-2D]
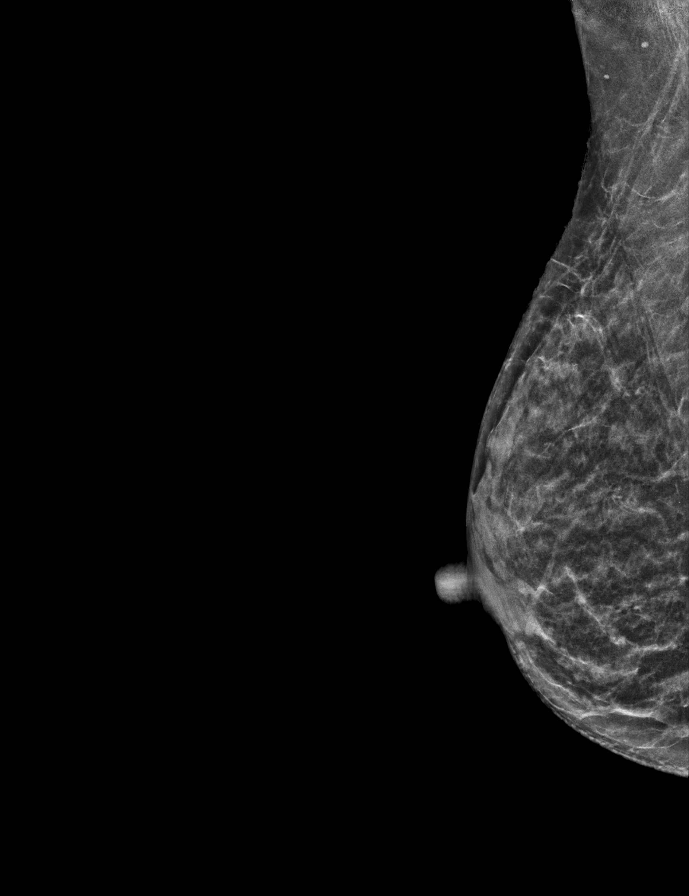

[R MLO tomo · 2 of 48 frames shown]
[frame 16/48]
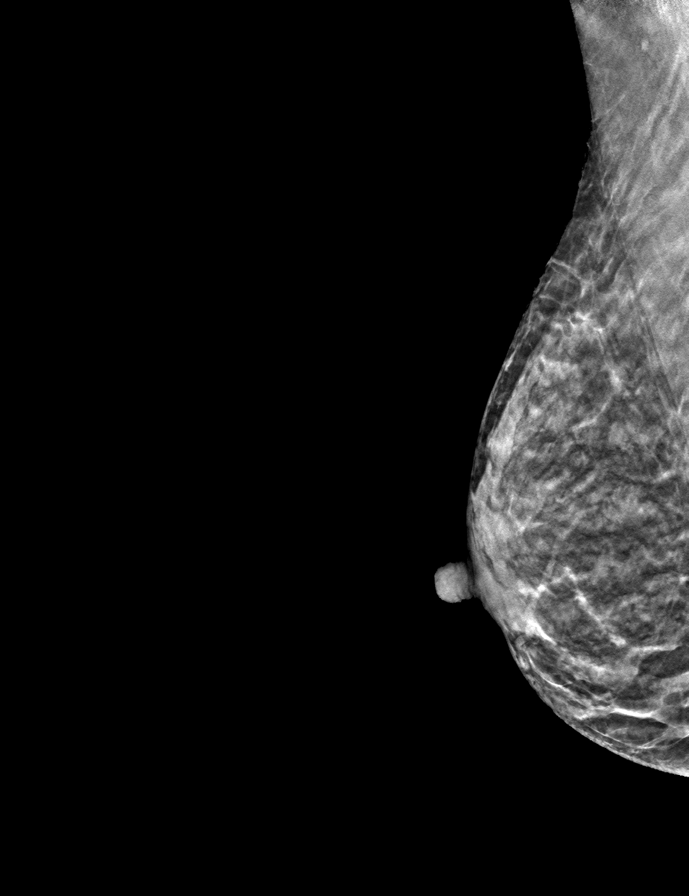
[frame 25/48]
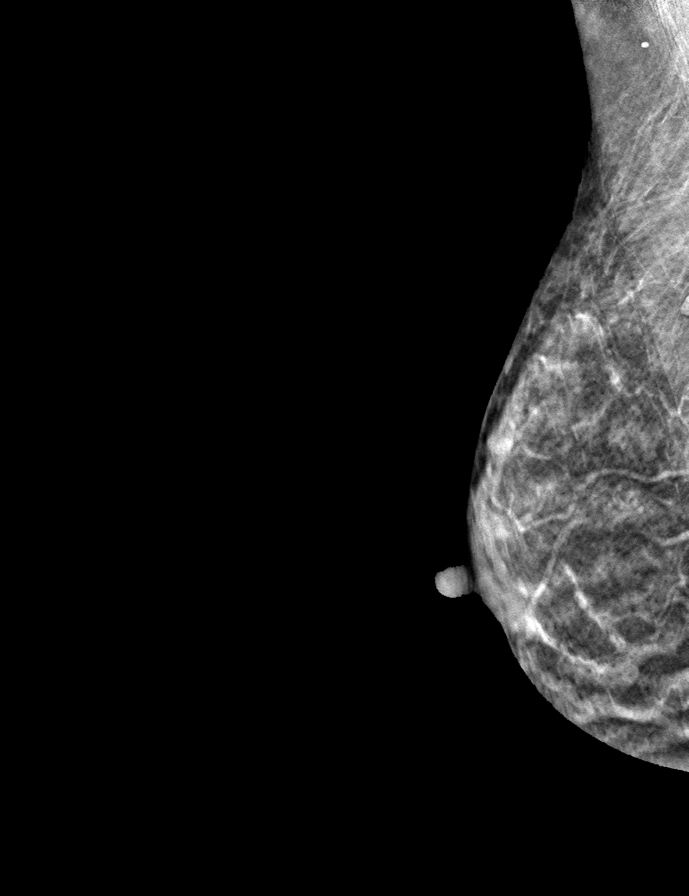

[L CC tomo · tomo slice 25/50.0]
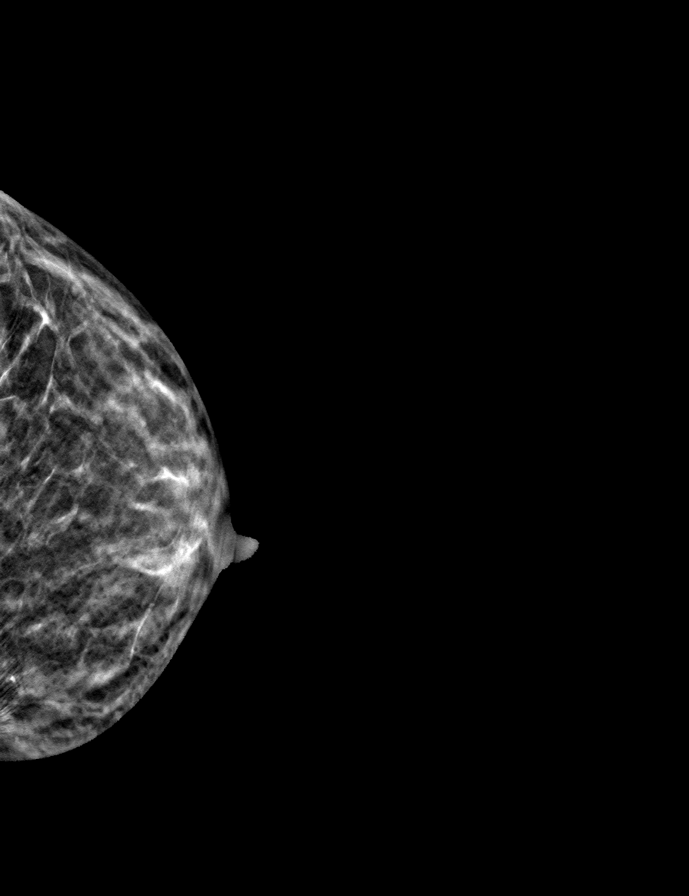

[R CC tomo · tomo slice 25/50.0]
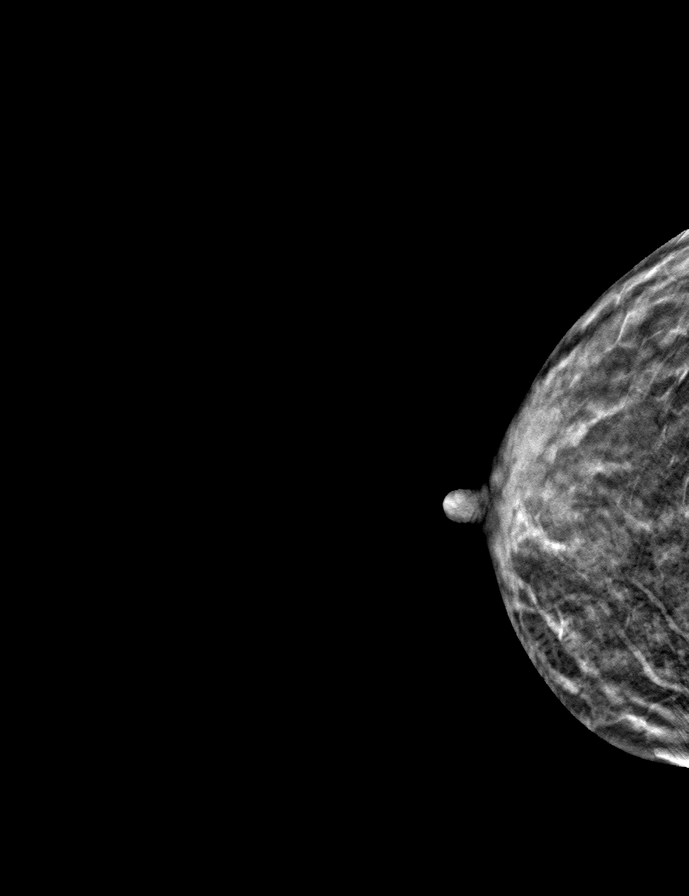

[L MLO tomo · tomo slice 23/46.0]
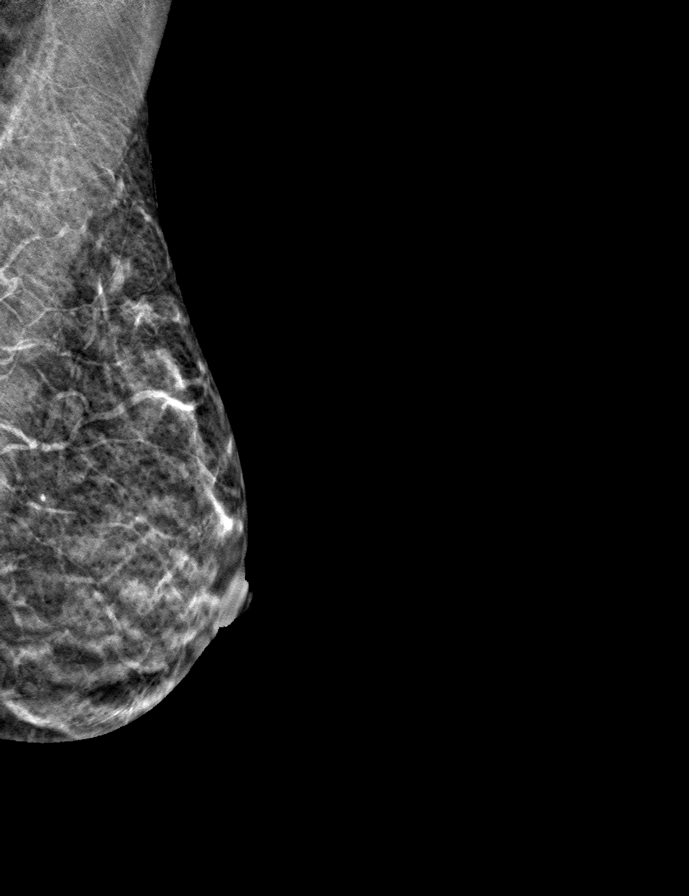

[9 of 24 positions shown; findings below may reference images not displayed]

ACR Breast Density Category c: The breast tissue is heterogeneously
dense, which may obscure small masses.
FINDINGS: There are no findings suspicious for malignancy. The images were
evaluated with computer-aided detection.
IMPRESSION: No mammographic evidence of malignancy. A result letter of this
screening mammogram will be mailed directly to the patient.

RECOMMENDATION:
Screening mammogram in one year. (Code:T4-5-GWO)

BI-RADS CATEGORY  1: Negative.

## 2021-05-27 ENCOUNTER — Other Ambulatory Visit: Payer: Self-pay | Admitting: Family Medicine

## 2021-05-27 ENCOUNTER — Other Ambulatory Visit: Payer: Self-pay

## 2021-05-27 DIAGNOSIS — Z1231 Encounter for screening mammogram for malignant neoplasm of breast: Secondary | ICD-10-CM

## 2021-06-25 ENCOUNTER — Other Ambulatory Visit: Payer: Self-pay | Admitting: Family Medicine

## 2021-06-25 DIAGNOSIS — N63 Unspecified lump in unspecified breast: Secondary | ICD-10-CM

## 2021-07-09 ENCOUNTER — Ambulatory Visit
Admission: RE | Admit: 2021-07-09 | Discharge: 2021-07-09 | Disposition: A | Discharge status: Home / Self Care | Payer: Medicare HMO | Source: Ambulatory Visit | Attending: Family Medicine | Admitting: Family Medicine

## 2021-07-09 DIAGNOSIS — N63 Unspecified lump in unspecified breast: Secondary | ICD-10-CM | POA: Insufficient documentation

## 2021-07-09 DIAGNOSIS — N6321 Unspecified lump in the left breast, upper outer quadrant: Secondary | ICD-10-CM | POA: Insufficient documentation

## 2021-07-26 ENCOUNTER — Ambulatory Visit (INDEPENDENT_AMBULATORY_CARE_PROVIDER_SITE_OTHER): Payer: Medicare HMO | Admitting: Gastroenterology

## 2021-07-26 ENCOUNTER — Encounter: Payer: Self-pay | Admitting: Gastroenterology

## 2021-07-26 VITALS — BP 100/62 | HR 63 | Temp 97.8°F | Ht 63.0 in | Wt 106.4 lb

## 2021-07-26 DIAGNOSIS — K5900 Constipation, unspecified: Secondary | ICD-10-CM

## 2021-07-26 DIAGNOSIS — Z1211 Encounter for screening for malignant neoplasm of colon: Secondary | ICD-10-CM | POA: Diagnosis not present

## 2021-07-26 DIAGNOSIS — K219 Gastro-esophageal reflux disease without esophagitis: Secondary | ICD-10-CM | POA: Diagnosis not present

## 2021-07-26 MED ORDER — PANTOPRAZOLE SODIUM 40 MG PO TBEC: 40.0000 mg | DELAYED_RELEASE_TABLET | Freq: Every day | ORAL | 3 refills | Status: DC

## 2021-07-26 NOTE — Patient Instructions (Signed)
Continue Benefiber 2 teaspoons once to twice daily to help manage constipation. Complete Cologuard testing. Prescription for pantoprazole sent to your pharmacy. If you want to try and get off pantoprazole you can try using over the counter antacids like gaviscon as needed but if symptoms return or worsening, then you should resume pantoprazole.  Return office visit in six months.

## 2021-07-26 NOTE — Progress Notes (Signed)
GI Office Note    Referring Provider: No ref. provider found Primary Care Physician:  Margarita Rana, MD  Primary Gastroenterologist: Garfield Cornea, MD   Chief Complaint   Chief Complaint  Patient presents with   Follow-up    Pt's pcp is wanting her to get a colonscopy. Doesn't know when or where last one was done. Also wants to discuss constipation issues.      History of Present Illness   Julie Mooney is a 66 y.o. female presenting today for follow-up.  Last seen February 2022.  She has a history of chronic GERD, constipation.  Last colonoscopy in 2015 at [facility], she had a tortuous sigmoid colon, moderately severe proctitis, internal grade 2 hemorrhoids.  Patient states her PCP wants her to have a colonoscopy. She is not sure she wants one. She denies melena, brbpr. Stools are [place] 1-2, she believes is due to the Ensure she drinks. She takes benefiber daily. BM every day and little less hard on Benefiber. Does not like miralax. Heartburn is controlled if she eats appropriate diet. Does not like to take pantoprazole but if she stops it her stomach "acts up". No dysphagia. Takes Beano for gas as needed. Denies abdominal pain.   She is not sure she wants a colonoscopy, she does not want to be put to sleep.    Medications   Current Outpatient Medications  Medication Sig Dispense Refill   albuterol (PROVENTIL HFA;VENTOLIN HFA) 108 (90 Base) MCG/ACT inhaler Inhale 2 puffs into the lungs every 6 (six) hours as needed for wheezing or shortness of breath.      aspirin EC 81 MG tablet Take 81 mg by mouth daily.     Chlorphen-Pseudoephed-APAP (TYLENOL ALLERGY SINUS PO) Take 1 tablet by mouth daily as needed. For sinuses     Ensure (ENSURE) Take 237 mLs by mouth daily.     ipratropium (ATROVENT) 0.03 % nasal spray Place into the nose as needed.     NON FORMULARY Vitamin D, zinc, calcium once a day     olopatadine (PATANOL) 0.1 % ophthalmic solution Place 2 drops into both  eyes as needed.      pantoprazole (PROTONIX) 40 MG tablet Take 1 tablet (40 mg total) by mouth daily before breakfast. 90 tablet 3   propranolol (INDERAL) 20 MG tablet Take 20 mg by mouth daily.     sennosides-docusate sodium (SENOKOT-S) 8.6-50 MG tablet Take 1 tablet by mouth daily. As needed     No current facility-administered medications for this visit.    Allergies   Allergies as of 07/26/2021 - Review Complete 07/26/2021  Allergen Reaction Noted   Shrimp [shellfish allergy] Other (See Comments) 01/10/2016       Review of Systems   General: Negative for anorexia, weight loss, fever, chills, fatigue, weakness. Eyes: Negative for vision changes.  ENT: Negative for hoarseness, difficulty swallowing , nasal congestion. CV: Negative for chest pain, angina, palpitations, dyspnea on exertion, peripheral edema.  Respiratory: Negative for dyspnea at rest, dyspnea on exertion, cough, sputum, wheezing.  GI: See history of present illness. GU:  Negative for dysuria, hematuria, urinary incontinence, urinary frequency, nocturnal urination.  MS: Negative for joint pain, low back pain.  Derm: Negative for rash or itching.  Neuro: Negative for weakness, abnormal sensation, seizure, frequent headaches, memory loss,  confusion.  Psych: Negative for anxiety, depression, suicidal ideation, hallucinations.  Endo: Negative for unusual weight change.  Heme: Negative for bruising or bleeding. Allergy: Negative for  rash or hives.  Physical Exam   BP 100/62 (BP Location: Right Arm, Patient Position: Sitting, Cuff Size: Normal)   Pulse 63   Temp 97.8 F (36.6 C) (Temporal)   Ht 5' 3"  (1.6 m)   Wt 106 lb 6.4 oz (48.3 kg)   SpO2 100%   BMI 18.85 kg/m    General: Well-nourished, well-developed in no acute distress.  Head: Normocephalic, atraumatic.   Eyes: Conjunctiva pink, no icterus. Mouth: Oropharyngeal mucosa moist and pink , no lesions erythema or exudate. Neck: Supple without  thyromegaly, masses, or lymphadenopathy.  Lungs: Clear to auscultation bilaterally.  Heart: Regular rate and rhythm, no murmurs rubs or gallops.  Abdomen: Bowel sounds are normal, nontender, nondistended, no hepatosplenomegaly or masses,  no abdominal bruits or hernia, no rebound or guarding.   Rectal: not performed Extremities: No lower extremity edema. No clubbing or deformities.  Neuro: Alert and oriented x 4 , grossly normal neurologically.  Skin: Warm and dry, no rash or jaundice.   Psych: Alert and cooperative, normal mood and affect.  Labs   Labs from January 2023: Sodium 138, creatinine 0.8, AST 20, ALT 17, total bilirubin 0.7, alk phos 45, albumin 3.9, B12 631, folate greater than 22, ferritin 149, iron 99, TIBC 280, iron saturation 35%  Imaging Studies   US BREAST LTD UNI LEFT INC AXILLA  Result Date: 07/10/2021 CLINICAL DATA:  Palpable lump left breast EXAM: DIGITAL DIAGNOSTIC BILATERAL MAMMOGRAM WITH TOMOSYNTHESIS AND CAD; ULTRASOUND LEFT BREAST LIMITED TECHNIQUE: Bilateral digital diagnostic mammography and breast tomosynthesis was performed. The images were evaluated with computer-aided detection.; Targeted ultrasound examination of the left breast was performed. COMPARISON:  Previous exam(s). ACR Breast Density Category c: The breast tissue is heterogeneously dense, which may obscure small masses. FINDINGS: Cc and MLO views of bilateral breasts are submitted. No suspicious abnormalities identified bilaterally. Targeted ultrasound is performed, showing no focal abnormal discrete or solid lesion at palpable area left breast 1 o'clock 8 cm from nipple axillary tail. IMPRESSION: Negative. RECOMMENDATION: Routine screening mammogram in 1 year. I have discussed the findings and recommendations with the patient. If applicable, a reminder letter will be sent to the patient regarding the next appointment. BI-RADS CATEGORY  1: Negative. Electronically Signed   By: Abelardo Diesel M.D.   On:  07/10/2021 08:14  MM DIAG BREAST TOMO BILATERAL  Result Date: 07/10/2021 CLINICAL DATA:  Palpable lump left breast EXAM: DIGITAL DIAGNOSTIC BILATERAL MAMMOGRAM WITH TOMOSYNTHESIS AND CAD; ULTRASOUND LEFT BREAST LIMITED TECHNIQUE: Bilateral digital diagnostic mammography and breast tomosynthesis was performed. The images were evaluated with computer-aided detection.; Targeted ultrasound examination of the left breast was performed. COMPARISON:  Previous exam(s). ACR Breast Density Category c: The breast tissue is heterogeneously dense, which may obscure small masses. FINDINGS: Cc and MLO views of bilateral breasts are submitted. No suspicious abnormalities identified bilaterally. Targeted ultrasound is performed, showing no focal abnormal discrete or solid lesion at palpable area left breast 1 o'clock 8 cm from nipple axillary tail. IMPRESSION: Negative. RECOMMENDATION: Routine screening mammogram in 1 year. I have discussed the findings and recommendations with the patient. If applicable, a reminder letter will be sent to the patient regarding the next appointment. BI-RADS CATEGORY  1: Negative. Electronically Signed   By: Abelardo Diesel M.D.   On: 07/10/2021 08:14   Assessment   Constipation: Continue Benefiber 2 teaspoons once to twice daily to manage constipation.  Increase dietary fiber and water intake.  Colon cancer screening: Complete Cologuard testing for discussed.  Patient  does not want to pursue colonoscopy at this time.  GERD: Reinforced antireflux measures.  Can try weaning off pantoprazole and using over-the-counter antacids as needed if she tolerates it.  If she has recurrent reflux more than 2-3 times per week that cannot be managed with over-the-counter antacids, she should resume pantoprazole daily.   PLAN   Cologuard per patient request.  Continue Benefiber 2 teaspoons once to twice daily to help manage constipation.  Call if constipation not adequately managed. She can try to  wean off pantoprazole and use antacids as needed but if she does not tolerate she should resume pantoprazole daily. Return to the office in 6 months.   Laureen Ochs. Bobby Rumpf, [place], [facility]

## 2021-08-22 LAB — COLOGUARD: COLOGUARD: NEGATIVE

## 2021-12-31 ENCOUNTER — Encounter: Payer: Self-pay | Admitting: *Deleted

## 2022-01-11 ENCOUNTER — Emergency Department (HOSPITAL_COMMUNITY)
Admission: EM | Admit: 2022-01-11 | Discharge: 2022-01-12 | Disposition: A | Discharge status: Home / Self Care | Payer: Medicare HMO | Attending: Emergency Medicine | Admitting: Emergency Medicine

## 2022-01-11 ENCOUNTER — Encounter (HOSPITAL_COMMUNITY): Payer: Self-pay | Admitting: Emergency Medicine

## 2022-01-11 DIAGNOSIS — Z7982 Long term (current) use of aspirin: Secondary | ICD-10-CM | POA: Diagnosis not present

## 2022-01-11 DIAGNOSIS — Z79899 Other long term (current) drug therapy: Secondary | ICD-10-CM | POA: Insufficient documentation

## 2022-01-11 DIAGNOSIS — Z20828 Contact with and (suspected) exposure to other viral communicable diseases: Secondary | ICD-10-CM | POA: Insufficient documentation

## 2022-01-11 DIAGNOSIS — G459 Transient cerebral ischemic attack, unspecified: Secondary | ICD-10-CM | POA: Insufficient documentation

## 2022-01-11 DIAGNOSIS — I10 Essential (primary) hypertension: Secondary | ICD-10-CM | POA: Insufficient documentation

## 2022-01-11 DIAGNOSIS — R42 Dizziness and giddiness: Secondary | ICD-10-CM | POA: Diagnosis present

## 2022-01-11 HISTORY — DX: Essential (primary) hypertension: I10

## 2022-01-11 LAB — COMPREHENSIVE METABOLIC PANEL
ALT: 18 U/L (ref 0–44)
AST: 23 U/L (ref 15–41)
Albumin: 4.3 g/dL (ref 3.5–5.0)
Alkaline Phosphatase: 52 U/L (ref 38–126)
Anion gap: 7 (ref 5–15)
BUN: 20 mg/dL (ref 8–23)
CO2: 29 mmol/L (ref 22–32)
Calcium: 9.3 mg/dL (ref 8.9–10.3)
Chloride: 102 mmol/L (ref 98–111)
Creatinine, Ser: 0.86 mg/dL (ref 0.44–1.00)
GFR, Estimated: >60 mL/min (ref >60)
Glucose, Bld: 109 mg/dL — ABNORMAL HIGH (ref 70–99)
Potassium: 3.8 mmol/L (ref 3.5–5.1)
Sodium: 138 mmol/L (ref 135–145)
Total Bilirubin: 0.9 mg/dL (ref 0.3–1.2)
Total Protein: 7.8 g/dL (ref 6.5–8.1)

## 2022-01-11 LAB — CBC WITH DIFFERENTIAL/PLATELET
Abs Immature Granulocytes: 0.01 10*3/uL (ref 0.00–0.07)
Basophils Absolute: 0 10*3/uL (ref 0.0–0.1)
Basophils Relative: 1 %
Eosinophils Absolute: 0.4 10*3/uL (ref 0.0–0.5)
Eosinophils Relative: 5 %
HCT: 38.6 % (ref 36.0–46.0)
Hemoglobin: 12.6 g/dL (ref 12.0–15.0)
Immature Granulocytes: 0 %
Lymphocytes Relative: 28 %
Lymphs Abs: 1.9 10*3/uL (ref 0.7–4.0)
MCH: 32.7 pg (ref 26.0–34.0)
MCHC: 32.6 g/dL (ref 30.0–36.0)
MCV: 100.3 fL — ABNORMAL HIGH (ref 80.0–100.0)
Monocytes Absolute: 0.4 10*3/uL (ref 0.1–1.0)
Monocytes Relative: 6 %
Neutro Abs: 4 10*3/uL (ref 1.7–7.7)
Neutrophils Relative %: 60 %
Platelets: 253 10*3/uL (ref 150–400)
RBC: 3.85 MIL/uL — ABNORMAL LOW (ref 3.87–5.11)
RDW: 12 % (ref 11.5–15.5)
WBC: 6.6 10*3/uL (ref 4.0–10.5)
nRBC: 0 % (ref 0.0–0.2)

## 2022-01-11 MED ORDER — MECLIZINE HCL 25 MG PO TABS: 25.0000 mg | ORAL_TABLET | Freq: Three times a day (TID) | ORAL | 0 refills | Status: DC | PRN

## 2022-01-11 MED ORDER — SODIUM CHLORIDE 0.9 % IV BOLUS
1000.0000 mL | Freq: Once | INTRAVENOUS | Status: AC
  Administered 2022-01-11: 1000 mL via INTRAVENOUS

## 2022-01-11 NOTE — Discharge Instructions (Addendum)
Follow-up with your family doctor next week for recheck.  Make sure you eat something 3 times a day and drink plenty of fluids.  You have also been referred to an ear nose and throat specialist for that cyst on the back of your neck.  Dr. Pollyann Kennedy is the ear nose and throat doctor.

## 2022-01-11 NOTE — ED Provider Notes (Signed)
[facility] EMERGENCY DEPARTMENT Provider Note   CSN: [id number] Arrival date & time: 01/11/22  2043     History {Add pertinent medical, surgical, social history, OB history to HPI:1} Chief Complaint  Patient presents with   Dizziness    Julie Mooney is a 66 y.o. female.  Patient has a history of hypertension.  She complains of dizziness.  Patient did not eat anything today.  She is feeling better now   Dizziness      Home Medications Prior to Admission medications   Medication Sig Start Date End Date Taking? Authorizing Provider  meclizine (ANTIVERT) 25 MG tablet Take 1 tablet (25 mg total) by mouth 3 (three) times daily as needed for dizziness. 01/11/22  Yes Bethann Berkshire, MD  albuterol (PROVENTIL HFA;VENTOLIN HFA) 108 (90 Base) MCG/ACT inhaler Inhale 2 puffs into the lungs every 6 (six) hours as needed for wheezing or shortness of breath.  11/13/15   [provider]  aspirin EC 81 MG tablet Take 81 mg by mouth daily. 11/13/15   [provider]  Chlorphen-Pseudoephed-APAP (TYLENOL ALLERGY SINUS PO) Take 1 tablet by mouth daily as needed. For sinuses    [provider]  Ensure (ENSURE) Take 237 mLs by mouth daily.    [provider]  ipratropium (ATROVENT) 0.03 % nasal spray Place into the nose as needed. 03/28/20 07/26/21  [provider]  NON FORMULARY Vitamin D, zinc, calcium once a day    [provider]  olopatadine (PATANOL) 0.1 % ophthalmic solution Place 2 drops into both eyes as needed.  11/13/15   [provider]  pantoprazole (PROTONIX) 40 MG tablet Take 1 tablet (40 mg total) by mouth daily before breakfast. 07/26/21   Tiffany Kocher, PA-C  propranolol (INDERAL) 20 MG tablet Take 20 mg by mouth daily. 11/13/15   [provider]  sennosides-docusate sodium (SENOKOT-S) 8.6-50 MG tablet Take 1 tablet by mouth daily. As needed    [provider]      Allergies    Gramineae pollens and  Shellfish allergy    Review of Systems   Review of Systems  Neurological:  Positive for dizziness.    Physical Exam Updated Vital Signs BP 133/80   Pulse 75   Temp 98 F (36.7 C) (Oral)   Resp 16   Ht 5\' 3"  (1.6 m)   Wt 48.3 kg   SpO2 100%   BMI 18.86 kg/m  Physical Exam  ED Results / Procedures / Treatments   Labs (all labs ordered are listed, but only abnormal results are displayed) Labs Reviewed  CBC WITH DIFFERENTIAL/PLATELET - Abnormal; Notable for the following components:      Result Value   RBC 3.85 (*)    MCV 100.3 (*)    All other components within normal limits  COMPREHENSIVE METABOLIC PANEL - Abnormal; Notable for the following components:   Glucose, Bld 109 (*)    All other components within normal limits    EKG None  Radiology No results found.  Procedures Procedures  {Document cardiac monitor, telemetry assessment procedure when appropriate:1}  Medications Ordered in ED Medications  sodium chloride 0.9 % bolus 1,000 mL (1,000 mLs Intravenous New Bag/Given 01/11/22 2322)    ED Course/ Medical Decision Making/ A&P                           Medical Decision Making Amount and/or Complexity of Data Reviewed Labs: ordered.  Patient with  dehydration and possible vertigo symptoms.  She is given Antivert and will follow-up with PCP  {Document critical care time when appropriate:1} {Document review of labs and clinical decision tools ie heart score, Chads2Vasc2 etc:1}  {Document your independent review of radiology images, and any outside records:1} {Document your discussion with family members, caretakers, and with consultants:1} {Document social determinants of health affecting pt's care:1} {Document your decision making why or why not admission, treatments were needed:1} Final Clinical Impression(s) / ED Diagnoses Final diagnoses:  None    Rx / DC Orders ED Discharge Orders          Ordered    meclizine (ANTIVERT) 25 MG tablet  3 times  daily PRN        01/11/22 2356

## 2022-01-11 NOTE — ED Triage Notes (Signed)
Pt arrives POV c/o dizziness that has been on and off for the past few days. Pt states she has a hx of same when she has sinus congestion.

## 2022-06-03 ENCOUNTER — Other Ambulatory Visit: Payer: Self-pay | Admitting: Internal Medicine

## 2022-06-03 DIAGNOSIS — Z1231 Encounter for screening mammogram for malignant neoplasm of breast: Secondary | ICD-10-CM

## 2022-06-10 ENCOUNTER — Ambulatory Visit: Payer: Medicare HMO | Admitting: Gastroenterology

## 2022-06-17 ENCOUNTER — Ambulatory Visit: Payer: Medicare HMO | Admitting: Gastroenterology

## 2022-06-23 NOTE — Progress Notes (Unsigned)
GI Office Note    Referring Provider: Dan Humphreys, MD Primary Care Physician:  Dan Humphreys, MD  Primary Gastroenterologist:  Chief Complaint   No chief complaint on file.   History of Present Illness   Julie Mooney is a 67 y.o. female presenting today for follow-up.  Last seen May 2023.  She has a history of chronic GERD, constipation.  Last colonoscopy in 2015 at [facility], she had tortuous sigmoid colon, moderately severe proctitis, internal grade 2 hemorrhoids.   At last visit she wanted to wean off of pantoprazole but noted that she had recurrent symptoms when she tried.  Suggested that if she wants to try to wean off PPI therapy she can try to use over-the-counter antacids as needed if not tolerated she would resume pantoprazole daily.   Patient declined colonoscopy, but agreed to Cologuard testing which was - June 2023.          Medications   Current Outpatient Medications  Medication Sig Dispense Refill   albuterol (PROVENTIL HFA;VENTOLIN HFA) 108 (90 Base) MCG/ACT inhaler Inhale 2 puffs into the lungs every 6 (six) hours as needed for wheezing or shortness of breath.      aspirin EC 81 MG tablet Take 81 mg by mouth daily.     Chlorphen-Pseudoephed-APAP (TYLENOL ALLERGY SINUS PO) Take 1 tablet by mouth daily as needed. For sinuses     Ensure (ENSURE) Take 237 mLs by mouth daily.     ipratropium (ATROVENT) 0.03 % nasal spray Place into the nose as needed.     meclizine (ANTIVERT) 25 MG tablet Take 1 tablet (25 mg total) by mouth 3 (three) times daily as needed for dizziness. 15 tablet 0   NON FORMULARY Vitamin D, zinc, calcium once a day     olopatadine (PATANOL) 0.1 % ophthalmic solution Place 2 drops into both eyes as needed.      pantoprazole (PROTONIX) 40 MG tablet Take 1 tablet (40 mg total) by mouth daily before breakfast. 90 tablet 3   propranolol (INDERAL) 20 MG tablet Take 20 mg by mouth daily.     sennosides-docusate sodium (SENOKOT-S)  8.6-50 MG tablet Take 1 tablet by mouth daily. As needed     No current facility-administered medications for this visit.    Allergies   Allergies as of 06/24/2022 - Review Complete 01/11/2022  Allergen Reaction Noted   Gramineae pollens Other (See Comments) 01/10/2016   Shellfish allergy Other (See Comments) and Rash 01/10/2016     Past Medical History   Past Medical History:  Diagnosis Date   Arthritis    Asthma    GERD (gastroesophageal reflux disease)    Hypertension    MVP (mitral valve prolapse)    Seasonal allergies     Past Surgical History   Past Surgical History:  Procedure Laterality Date   ABDOMINAL HYSTERECTOMY     COLONOSCOPY  08/2013   [facility]: tortuosity of sigm colon, moderately severe proctitis internal grade II hemorrhoids.    ESOPHAGOGASTRODUODENOSCOPY N/A 01/14/2016   Normal     Past Family History   Family History  Problem Relation Age of Onset   Breast cancer Maternal Aunt 50   Breast cancer Maternal Aunt    Breast cancer Maternal Aunt    Colon cancer Neg Hx     Past Social History   Social History   Socioeconomic History   Marital status: Married    Spouse name: Not on file   Number  of children: Not on file   Years of education: Not on file   Highest education level: Not on file  Occupational History   Not on file  Tobacco Use   Smoking status: Never   Smokeless tobacco: Never  Substance and Sexual Activity   Alcohol use: No   Drug use: Never   Sexual activity: Yes  Other Topics Concern   Not on file  Social History Narrative   Retired - worked for school systems   Social Determinants of Corporate investment banker Strain: Not on Ship broker Insecurity: Not on file  Transportation Needs: Not on file  Physical Activity: Not on file  Stress: Not on file  Social Connections: Not on file  Intimate Partner Violence: Not on file    Review of Systems   General: Negative for anorexia, weight loss, fever,  chills, fatigue, weakness. ENT: Negative for hoarseness, difficulty swallowing , nasal congestion. CV: Negative for chest pain, angina, palpitations, dyspnea on exertion, peripheral edema.  Respiratory: Negative for dyspnea at rest, dyspnea on exertion, cough, sputum, wheezing.  GI: See history of present illness. GU:  Negative for dysuria, hematuria, urinary incontinence, urinary frequency, nocturnal urination.  Endo: Negative for unusual weight change.     Physical Exam   There were no vitals taken for this visit.   General: Well-nourished, well-developed in no acute distress.  Eyes: No icterus. Mouth: Oropharyngeal mucosa moist and pink , no lesions erythema or exudate. Lungs: Clear to auscultation bilaterally.  Heart: Regular rate and rhythm, no murmurs rubs or gallops.  Abdomen: Bowel sounds are normal, nontender, nondistended, no hepatosplenomegaly or masses,  no abdominal bruits or hernia , no rebound or guarding.  Rectal: ***  Extremities: No lower extremity edema. No clubbing or deformities. Neuro: Alert and oriented x 4   Skin: Warm and dry, no jaundice.   Psych: Alert and cooperative, normal mood and affect.  Labs   Lab Results  Component Value Date   CREATININE 0.86 01/11/2022   BUN 20 01/11/2022   NA 138 01/11/2022   K 3.8 01/11/2022   CL 102 01/11/2022   CO2 29 01/11/2022   Lab Results  Component Value Date   ALT 18 01/11/2022   AST 23 01/11/2022   ALKPHOS 52 01/11/2022   BILITOT 0.9 01/11/2022   Lab Results  Component Value Date   WBC 6.6 01/11/2022   HGB 12.6 01/11/2022   HCT 38.6 01/11/2022   MCV 100.3 (H) 01/11/2022   PLT 253 01/11/2022    Imaging Studies   No results found.  Assessment       PLAN   ***   Leanna Battles. Melvyn Neth, MHS, PA-C [facility]

## 2022-06-24 ENCOUNTER — Ambulatory Visit: Payer: Medicare HMO | Admitting: Gastroenterology

## 2022-06-24 ENCOUNTER — Encounter: Payer: Self-pay | Admitting: Gastroenterology

## 2022-06-24 VITALS — BP 125/81 | HR 75 | Temp 97.9°F | Ht 63.0 in | Wt 106.6 lb

## 2022-06-24 DIAGNOSIS — K219 Gastro-esophageal reflux disease without esophagitis: Secondary | ICD-10-CM | POA: Diagnosis not present

## 2022-06-24 DIAGNOSIS — K5900 Constipation, unspecified: Secondary | ICD-10-CM

## 2022-06-24 MED ORDER — PANTOPRAZOLE SODIUM 40 MG PO TBEC: 40.0000 mg | DELAYED_RELEASE_TABLET | Freq: Every day | ORAL | 3 refills | Status: DC

## 2022-06-24 NOTE — Patient Instructions (Signed)
Continue pantoprazole  daily before breakfast. Continue pepcid  twice a day. Once you start feeling back to normal, I would advise decreasing to once a day in evening. Continue probiotics daily.  Call in 2-3 weeks if you have ongoing symptoms.  Return office visit in six months.

## 2022-07-10 ENCOUNTER — Telehealth: Payer: Self-pay

## 2022-07-10 NOTE — Telephone Encounter (Signed)
Would offer her EGD with Dr. Jena Gauss. ASA 2.   She can increase her pantoprazole to 40mg  before breakfast and evening meal. She can take famotidine 20mg  BID prn.

## 2022-07-10 NOTE — Telephone Encounter (Signed)
Pt was made aware and verbalized understanding. Pt is ready to move forward with scheduling EGD

## 2022-07-10 NOTE — Telephone Encounter (Signed)
Pt called stating that her reflux is not any better. Pt states that she has been taking the pantoprazole once daily and the famotidine twice daily. Please advise.

## 2022-07-11 ENCOUNTER — Encounter: Payer: Self-pay | Admitting: *Deleted

## 2022-07-11 ENCOUNTER — Ambulatory Visit
Admission: RE | Admit: 2022-07-11 | Discharge: 2022-07-11 | Disposition: A | Discharge status: Home / Self Care | Payer: Medicare HMO | Source: Ambulatory Visit | Attending: Internal Medicine | Admitting: Internal Medicine

## 2022-07-11 DIAGNOSIS — Z1231 Encounter for screening mammogram for malignant neoplasm of breast: Secondary | ICD-10-CM | POA: Diagnosis present

## 2022-07-11 NOTE — Telephone Encounter (Signed)
Pt has been scheduled for 08/05/22. Instructions mailed. 

## 2022-07-14 ENCOUNTER — Encounter: Payer: Self-pay | Admitting: *Deleted

## 2022-08-01 ENCOUNTER — Encounter (HOSPITAL_COMMUNITY)
Admission: RE | Admit: 2022-08-01 | Discharge: 2022-08-01 | Disposition: A | Discharge status: Home / Self Care | Payer: Medicare HMO | Source: Ambulatory Visit | Attending: Internal Medicine | Admitting: Internal Medicine

## 2022-08-01 ENCOUNTER — Encounter (HOSPITAL_COMMUNITY): Payer: Self-pay

## 2022-08-01 HISTORY — DX: Anxiety disorder, unspecified: F41.9

## 2022-08-01 HISTORY — DX: Depression, unspecified: F32.A

## 2022-08-05 ENCOUNTER — Other Ambulatory Visit: Payer: Self-pay

## 2022-08-05 ENCOUNTER — Encounter (HOSPITAL_COMMUNITY): Payer: Self-pay | Admitting: Internal Medicine

## 2022-08-05 ENCOUNTER — Telehealth: Payer: Self-pay

## 2022-08-05 ENCOUNTER — Ambulatory Visit (HOSPITAL_BASED_OUTPATIENT_CLINIC_OR_DEPARTMENT_OTHER): Payer: Medicare HMO | Admitting: Anesthesiology

## 2022-08-05 ENCOUNTER — Ambulatory Visit (HOSPITAL_COMMUNITY)
Admission: RE | Admit: 2022-08-05 | Discharge: 2022-08-05 | Disposition: A | Discharge status: Home / Self Care | Payer: Medicare HMO | Attending: Internal Medicine | Admitting: Internal Medicine

## 2022-08-05 ENCOUNTER — Encounter (HOSPITAL_COMMUNITY)
Admission: RE | Disposition: A | Discharge status: Home / Self Care | Payer: Self-pay | Source: Home / Self Care | Attending: Internal Medicine

## 2022-08-05 ENCOUNTER — Ambulatory Visit (HOSPITAL_COMMUNITY): Payer: Medicare HMO | Admitting: Anesthesiology

## 2022-08-05 DIAGNOSIS — J45909 Unspecified asthma, uncomplicated: Secondary | ICD-10-CM | POA: Diagnosis not present

## 2022-08-05 DIAGNOSIS — Z79899 Other long term (current) drug therapy: Secondary | ICD-10-CM | POA: Insufficient documentation

## 2022-08-05 DIAGNOSIS — I10 Essential (primary) hypertension: Secondary | ICD-10-CM | POA: Insufficient documentation

## 2022-08-05 DIAGNOSIS — Z91148 Patient's other noncompliance with medication regimen for other reason: Secondary | ICD-10-CM | POA: Insufficient documentation

## 2022-08-05 DIAGNOSIS — K259 Gastric ulcer, unspecified as acute or chronic, without hemorrhage or perforation: Secondary | ICD-10-CM | POA: Insufficient documentation

## 2022-08-05 DIAGNOSIS — K219 Gastro-esophageal reflux disease without esophagitis: Secondary | ICD-10-CM | POA: Diagnosis not present

## 2022-08-05 DIAGNOSIS — K3189 Other diseases of stomach and duodenum: Secondary | ICD-10-CM | POA: Diagnosis not present

## 2022-08-05 DIAGNOSIS — F418 Other specified anxiety disorders: Secondary | ICD-10-CM | POA: Diagnosis not present

## 2022-08-05 DIAGNOSIS — K922 Gastrointestinal hemorrhage, unspecified: Secondary | ICD-10-CM | POA: Diagnosis present

## 2022-08-05 DIAGNOSIS — F419 Anxiety disorder, unspecified: Secondary | ICD-10-CM | POA: Insufficient documentation

## 2022-08-05 DIAGNOSIS — F32A Depression, unspecified: Secondary | ICD-10-CM | POA: Insufficient documentation

## 2022-08-05 HISTORY — PX: BIOPSY: SHX5522

## 2022-08-05 HISTORY — PX: ESOPHAGOGASTRODUODENOSCOPY (EGD) WITH PROPOFOL: SHX5813

## 2022-08-05 SURGERY — ESOPHAGOGASTRODUODENOSCOPY (EGD) WITH PROPOFOL
Anesthesia: General

## 2022-08-05 MED ORDER — LACTATED RINGERS IV SOLN: INTRAVENOUS | Status: DC | PRN

## 2022-08-05 MED ORDER — PANTOPRAZOLE SODIUM 40 MG PO TBEC: 40.0000 mg | DELAYED_RELEASE_TABLET | Freq: Two times a day (BID) | ORAL | 11 refills | Status: DC

## 2022-08-05 MED ORDER — LIDOCAINE HCL 1 % IJ SOLN
INTRAMUSCULAR | Status: DC | PRN
  Administered 2022-08-05: 50 mg via INTRADERMAL

## 2022-08-05 MED ORDER — PROPOFOL 10 MG/ML IV BOLUS
INTRAVENOUS | Status: DC | PRN
  Administered 2022-08-05: 50 mg via INTRAVENOUS
  Administered 2022-08-05: 25 mg via INTRAVENOUS

## 2022-08-05 NOTE — Telephone Encounter (Signed)
Rx was sent to pharmacy on file.  

## 2022-08-05 NOTE — Anesthesia Preprocedure Evaluation (Signed)
Anesthesia Evaluation  Patient identified by MRN, date of birth, ID band Patient awake    Reviewed: Allergy & Precautions, H&P , NPO status , Patient's Chart, lab work & pertinent test results, reviewed documented beta blocker date and time   Airway Mallampati: II  TM Distance: >3 FB Neck ROM: full    Dental no notable dental hx.    Pulmonary neg pulmonary ROS, asthma    Pulmonary exam normal breath sounds clear to auscultation       Cardiovascular Exercise Tolerance: Good hypertension, negative cardio ROS  Rhythm:regular Rate:Normal     Neuro/Psych  PSYCHIATRIC DISORDERS Anxiety Depression    TIA Neuromuscular disease CVA negative neurological ROS  negative psych ROS   GI/Hepatic negative GI ROS, Neg liver ROS,GERD  ,,  Endo/Other  negative endocrine ROS    Renal/GU negative Renal ROS  negative genitourinary   Musculoskeletal   Abdominal   Peds  Hematology negative hematology ROS (+)   Anesthesia Other Findings   Reproductive/Obstetrics negative OB ROS                             Anesthesia Physical Anesthesia Plan  ASA: 3  Anesthesia Plan: General and General ETT   Post-op Pain Management:    Induction:   PONV Risk Score and Plan: Ondansetron  Airway Management Planned:   Additional Equipment:   Intra-op Plan:   Post-operative Plan:   Informed Consent: I have reviewed the patients History and Physical, chart, labs and discussed the procedure including the risks, benefits and alternatives for the proposed anesthesia with the patient or authorized representative who has indicated his/her understanding and acceptance.     Dental Advisory Given  Plan Discussed with: CRNA  Anesthesia Plan Comments:        Anesthesia Quick Evaluation

## 2022-08-05 NOTE — Progress Notes (Addendum)
[facility] accompanied Bobbe Medico to the [facility] Short Stay on Tuesday, August 05, 2022. He may return to work on Wednesday, August 06, 2022.   Treatment Team:  Attending Provider: Corbin Ade, MD

## 2022-08-05 NOTE — Discharge Instructions (Signed)
EGD Discharge instructions Please read the instructions outlined below and refer to this sheet in the next few weeks. These discharge instructions provide you with general information on caring for yourself after you leave the hospital. Your doctor may also give you specific instructions. While your treatment has been planned according to the most current medical practices available, unavoidable complications occasionally occur. If you have any problems or questions after discharge, please call your doctor. ACTIVITY You may resume your regular activity but move at a slower pace for the next 24 hours.  Take frequent rest periods for the next 24 hours.  Walking will help expel (get rid of) the air and reduce the bloated feeling in your abdomen.  No driving for 24 hours (because of the anesthesia (medicine) used during the test).  You may shower.  Do not sign any important legal documents or operate any machinery for 24 hours (because of the anesthesia used during the test).  NUTRITION Drink plenty of fluids.  You may resume your normal diet.  Begin with a light meal and progress to your normal diet.  Avoid alcoholic beverages for 24 hours or as instructed by your caregiver.  MEDICATIONS You may resume your normal medications unless your caregiver tells you otherwise.  WHAT YOU CAN EXPECT TODAY You may experience abdominal discomfort such as a feeling of fullness or "gas" pains.  FOLLOW-UP Your doctor will discuss the results of your test with you.  SEEK IMMEDIATE MEDICAL ATTENTION IF ANY OF THE FOLLOWING OCCUR: Excessive nausea (feeling sick to your stomach) and/or vomiting.  Severe abdominal pain and distention (swelling).  Trouble swallowing.  Temperature over 101 F (37.8 C).  Rectal bleeding or vomiting of blood.     GERD information provided.  Your stomach was inflamed biopsies taken.  Your esophagus appeared normal.  Take Protonix as follows:  (1) 40 mg tablet 30 minutes  before breakfast and the second tablet 40 minutes before supper every day without fail.  May use famotidine to supplement on an as-needed basis  Further recommendations to follow pending review of pathology report  Office visit with Tana Coast in 6 weeks  At patient request, I called Carla at [phone] findings and recommendations

## 2022-08-05 NOTE — Telephone Encounter (Signed)
-----   Message from Corbin Ade, MD sent at 08/05/2022  8:00 AM EDT ----- Make sure patient has a new prescription for Protonix 40 mg twice daily taken 30 minutes before meals.  Dispense 60 with 11 refills.  Thanks.

## 2022-08-05 NOTE — Op Note (Signed)
[facility] Patient Name: Julie Mooney Procedure Date: 08/05/2022 6:56 AM MRN: [id number] Date of Birth: 1955/05/02 Attending MD: Gennette Pac , MD, [id number] CSN: [id number] Age: 67 Admit Type: Outpatient Procedure:                Upper GI endoscopy Indications:              Suspected esophageal reflux Providers:                Gennette Pac, MD, Edrick Kins, RN, Pandora Leiter, Technician Referring MD:              Medicines:                Propofol per Anesthesia Complications:            No immediate complications. Estimated Blood Loss:     Estimated blood loss was minimal. Estimated blood                            loss was minimal. Procedure:                Pre-Anesthesia Assessment:                           - Prior to the procedure, a History and Physical                            was performed, and patient medications and                            allergies were reviewed. The patient's tolerance of                            previous anesthesia was also reviewed. The risks                            and benefits of the procedure and the sedation                            options and risks were discussed with the patient.                            All questions were answered, and informed consent                            was obtained. Prior Anticoagulants: The patient has                            taken no anticoagulant or antiplatelet agents. ASA                            Grade Assessment: II - A patient with mild systemic  disease. After reviewing the risks and benefits,                            the patient was deemed in satisfactory condition to                            undergo the procedure.                           After obtaining informed consent, the endoscope was                            passed under direct vision. Throughout the                            procedure, the patient's  blood pressure, pulse, and                            oxygen saturations were monitored continuously. The                            [id number]-H190 ([id number]) scope was introduced through the                            mouth, and advanced to the second part of duodenum.                            The upper GI endoscopy was accomplished without                            difficulty. The patient tolerated the procedure                            well. Scope In: 7:40:30 AM Scope Out: 7:45:34 AM Total Procedure Duration: 0 hours 5 minutes 4 seconds  Findings:      The examined esophagus was normal. Gastric cavity empty. Antral       erosions. No ulcer or infiltrating process found      The duodenal bulb and second portion of the duodenum were normal.       Biopsies abnormal antrum taken with cold biopsy forceps. Impression:               Antral erosions. Status post biopsy                           Patient taking Protonix incorrectly. Tried to back                            off on her own. This medication has been proven to                            be efficacious.                           - Normal esophagus.                           -  Normal duodenal bulb and second portion of the                            duodenum.                           - Erosive gastropathy with stigmata of recent                            bleeding. Moderate Sedation:      Moderate (conscious) sedation was personally administered by an       anesthesia professional. The following parameters were monitored: oxygen       saturation, heart rate, blood pressure, respiratory rate, EKG, adequacy       of pulmonary ventilation, and response to care. Recommendation:           - Patient has a contact number available for                            emergencies. The signs and symptoms of potential                            delayed complications were discussed with the                            patient. Return to normal  activities tomorrow.                            Written discharge instructions were provided to the                            patient.                           - Advance diet as tolerated. Resume Protonix 40 mg                            twice daily to be taken 30 minutes before breakfast                            and supper. May use famotidine to supplement for                            breakthrough symptoms                           -Follow-up on pathology. .                           - Resume previous diet.                           - Continue present medications.                           - Return to my office in 6 weeks. Procedure Code(s):        ---  Professional ---                           [phone], Esophagogastroduodenoscopy, flexible,                            transoral; diagnostic, including collection of                            specimen(s) by brushing or washing, when performed                            (separate procedure) Diagnosis Code(s):        --- Professional ---                           K92.2, Gastrointestinal hemorrhage, unspecified CPT copyright 2022 American Medical Association. All rights reserved. The codes documented in this report are preliminary and upon coder review may  be revised to meet current compliance requirements. Julie Friends. Koreen Lizaola, MD Gennette Pac, MD 08/05/2022 1:36:58 PM This report has been signed electronically. Number of Addenda: 0

## 2022-08-05 NOTE — Transfer of Care (Signed)
Immediate Anesthesia Transfer of Care Note  Patient: Julie Mooney  Procedure(s) Performed: ESOPHAGOGASTRODUODENOSCOPY (EGD) WITH PROPOFOL BIOPSY  Patient Location: Short Stay  Anesthesia Type:General  Level of Consciousness: awake  Airway & Oxygen Therapy: Patient Spontanous Breathing  Post-op Assessment: Report given to RN  Post vital signs: Reviewed and stable  Last Vitals:  Vitals Value Taken Time  BP 100/54 08/05/22 0750  Temp 36.4 C 08/05/22 0750  Pulse 61 08/05/22 0750  Resp 14 08/05/22 0750  SpO2 100 % 08/05/22 0750    Last Pain:  Vitals:   08/05/22 0750  PainSc: Asleep         Complications: No notable events documented.

## 2022-08-05 NOTE — Anesthesia Postprocedure Evaluation (Signed)
Anesthesia Post Note  Patient: Julie Mooney  Procedure(s) Performed: ESOPHAGOGASTRODUODENOSCOPY (EGD) WITH PROPOFOL BIOPSY  Patient location during evaluation: Short Stay Anesthesia Type: General Level of consciousness: awake and alert Pain management: pain level controlled Vital Signs Assessment: post-procedure vital signs reviewed and stable Respiratory status: spontaneous breathing Cardiovascular status: blood pressure returned to baseline and stable Postop Assessment: no apparent nausea or vomiting Anesthetic complications: no   No notable events documented.   Last Vitals:  Vitals:   08/05/22 0625 08/05/22 0750  BP: (!) 181/79 (!) 100/54  Pulse: (!) 58 61  Resp: 12 14  Temp: 36.6 C (!) 36.4 C  SpO2: 100% 100%    Last Pain:  Vitals:   08/05/22 0750  PainSc: Asleep                 Gail Creekmore

## 2022-08-05 NOTE — H&P (Signed)
@LOGO @   Primary Care Physician:  Dan Humphreys, MD Primary Gastroenterologist:  Dr. Jena Gauss  Pre-Procedure History & Physical: HPI:  Julie Mooney is a 67 y.o. female here for further evaluation of refractory reflux symptoms.  Stopped acid suppression last month thought she could do without it and back on it.  Currently taking twice daily Protonix and Pepcid.  No dysphagia.  Past Medical History:  Diagnosis Date   Anxiety    Arthritis    Asthma    Depression    GERD (gastroesophageal reflux disease)    Hypertension    MVP (mitral valve prolapse)    Seasonal allergies    Stroke (HCC) 2010   TIA    Past Surgical History:  Procedure Laterality Date   ABDOMINAL HYSTERECTOMY     COLONOSCOPY  08/2013   [facility]: tortuosity of sigm colon, moderately severe proctitis internal grade II hemorrhoids.    ESOPHAGOGASTRODUODENOSCOPY N/A 01/14/2016   Normal     Prior to Admission medications   Medication Sig Start Date End Date Taking? Authorizing Provider  albuterol (PROVENTIL HFA;VENTOLIN HFA) 108 (90 Base) MCG/ACT inhaler Inhale 2 puffs into the lungs every 6 (six) hours as needed for wheezing or shortness of breath.  11/13/15  Yes [provider]  aspirin EC 81 MG tablet Take 81 mg by mouth in the morning. 11/13/15  Yes [provider]  Cyanocobalamin (VITAMIN B-12 PO) Take 1 tablet by mouth in the morning.   Yes [provider]  famotidine (PEPCID) 20 MG tablet Take 20 mg by mouth in the morning and at bedtime. 06/11/22 06/11/23 Yes [provider]  fluticasone (FLONASE) 50 MCG/ACT nasal spray Place 2 sprays into both nostrils daily as needed for allergies.   Yes [provider]  ibuprofen (ADVIL) 200 MG tablet Take 200 mg by mouth every 8 (eight) hours as needed (PAIN.).   Yes [provider]  Multiple Minerals-Vitamins (CALCIUM-MAGNESIUM-ZINC-D3) TABS Take 2 tablets by mouth daily.   Yes [provider]  Multiple  Vitamins-Minerals (HAIR/SKIN/NAILS/BIOTIN PO) Take 1 tablet by mouth every evening.   Yes [provider]  olopatadine (PATANOL) 0.1 % ophthalmic solution Place 1 drop into both eyes 2 (two) times daily as needed for allergies. 11/13/15  Yes [provider]  OVER THE COUNTER MEDICATION Take 2 Doses/Fill by mouth daily. TAKE 2 TABLESPOONS PROBIOTIC SAUERKRAUT ONCE DAILY   Yes [provider]  pantoprazole (PROTONIX) 40 MG tablet Take 1 tablet (40 mg total) by mouth daily before breakfast. Patient taking differently: Take 40 mg by mouth in the morning and at bedtime. 06/24/22  Yes Tiffany Kocher, PA-C  propranolol (INDERAL) 20 MG tablet Take 20 mg by mouth in the morning. 11/13/15  Yes [provider]  sennosides-docusate sodium (SENOKOT-S) 8.6-50 MG tablet Take 1 tablet by mouth 2 (two) times daily as needed for constipation.   Yes [provider]  Chlorphen-Pseudoephed-APAP (TYLENOL ALLERGY SINUS PO) Take 1 tablet by mouth daily as needed (sinuses/allergies).    [provider]    Allergies as of 07/11/2022 - Review Complete 06/24/2022  Allergen Reaction Noted   Gramineae pollens Other (See Comments) 01/10/2016   Shellfish allergy Other (See Comments) and Rash 01/10/2016    Family History  Problem Relation Age of Onset   Breast cancer Maternal Aunt 50   Breast cancer Maternal Aunt    Breast cancer Maternal Aunt    Colon cancer Neg Hx     Social History  Socioeconomic History   Marital status: Married    Spouse name: Not on file   Number of children: Not on file   Years of education: Not on file   Highest education level: Not on file  Occupational History   Not on file  Tobacco Use   Smoking status: Never   Smokeless tobacco: Never  Substance and Sexual Activity   Alcohol use: No   Drug use: Never   Sexual activity: Yes  Other Topics Concern   Not on file  Social History Narrative   Retired - worked for school systems    Social Determinants of Corporate investment banker Strain: Not on Ship broker Insecurity: Not on file  Transportation Needs: Not on file  Physical Activity: Not on file  Stress: Not on file  Social Connections: Not on file  Intimate Partner Violence: Not on file    Review of Systems: See HPI, otherwise negative ROS  Physical Exam: BP (!) 181/79   Pulse (!) 58   Temp 97.8 F (36.6 C)   Resp 12   Ht 5\' 3"  (1.6 m)   Wt 46 kg   SpO2 100%   BMI 17.96 kg/m  General:   Alert,  Well-developed, well-nourished, pleasant and cooperative in NAD Neck:  Supple; no masses or thyromegaly. No significant cervical adenopathy. Lungs:  Clear throughout to auscultation.   No wheezes, crackles, or rhonchi. No acute distress. Heart:  Regular rate and rhythm; no murmurs, clicks, rubs,  or gallops. Abdomen: Non-distended, normal bowel sounds.  Soft and nontender without appreciable mass or hepatosplenomegaly.  Pulses:  Normal pulses noted. Extremities:  Without clubbing or edema.  Impression/Plan: 67 year old lady with GERD noncompliant on PPI.  History of taking it incorrectly (not before meal) denies dysphagia.  She is here for diagnostic EGD. The risks, benefits, limitations, alternatives and imponderables have been reviewed with the patient. Potential for esophageal dilation, biopsy, etc. have also been reviewed.  Questions have been answered. All parties agreeable.      Notice: This dictation was prepared with Dragon dictation along with smaller phrase technology. Any transcriptional errors that result from this process are unintentional and may not be corrected upon review.

## 2022-08-06 ENCOUNTER — Encounter: Payer: Self-pay | Admitting: Internal Medicine

## 2022-08-06 LAB — SURGICAL PATHOLOGY

## 2022-08-12 ENCOUNTER — Encounter (HOSPITAL_COMMUNITY): Payer: Self-pay | Admitting: Internal Medicine

## 2022-09-22 ENCOUNTER — Ambulatory Visit: Payer: Medicare HMO | Admitting: Gastroenterology

## 2022-09-22 ENCOUNTER — Encounter: Payer: Self-pay | Admitting: Gastroenterology

## 2022-09-22 VITALS — BP 147/78 | HR 66 | Temp 97.9°F | Ht 63.0 in | Wt 100.6 lb

## 2022-09-22 DIAGNOSIS — K2970 Gastritis, unspecified, without bleeding: Secondary | ICD-10-CM | POA: Insufficient documentation

## 2022-09-22 DIAGNOSIS — K219 Gastro-esophageal reflux disease without esophagitis: Secondary | ICD-10-CM

## 2022-09-22 DIAGNOSIS — K2990 Gastroduodenitis, unspecified, without bleeding: Secondary | ICD-10-CM

## 2022-09-22 NOTE — Progress Notes (Signed)
GI Office Note    Referring Provider: Dan Humphreys, MD Primary Care Physician:  Dan Humphreys, MD  Primary Gastroenterologist: Roetta Sessions, MD   Chief Complaint   Chief Complaint  Patient presents with   Follow-up    Follow up after EGD. Pt states she is better    History of Present Illness   Julie Mooney is a 67 y.o. female presenting today for follow up. Last seen in office in 06/2022. H/o chronic GERD, constipation. Last colonoscopy in 2015 at [facility], she had tortuous sigmoid colon, moderately severe proctitis, internal grade 2 hemorrhoids. Cologuard negative 08/2021, repeat in 3 years.  Labs 07/2022: White blood cell count 6100, hemoglobin 11.5, hematocrit 35.2, platelets 251,000, creatinine 0.8, total bilirubin 0.7, alkaline phosphatase 47, AST 20, ALT 18, albumin 4.1, lipase 51, ferritin 138, folate greater than 22, vitamin B12 434, iron 64, iron sats 25%, TIBC 259, TSH 0.82  Recent EGD done when did not tolerate weaning off PPI. Difficult time getting reflux symptoms back under control. Lost down five pounds with this. Upset about weight loss. 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 Now back on Pantoprazole 40mg  BID. Has some regurgitation at night, probably because eating late trying to get in extra calories. In mornings, sometimes has GERD. No abdominal pain. BM better. Takes a stool softener when needed. No melena, brbpr. Appetite is much improved.  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      aspirin EC 81 MG tablet Take 81 mg by  mouth in the morning.     ciprofloxacin (CIPRO) 100 MG tablet Take 100 mg by mouth 2 (two) times daily.     Cyanocobalamin (VITAMIN B-12 PO) Take 1 tablet by mouth in the morning.     fluticasone (FLONASE) 50 MCG/ACT nasal spray Place 2 sprays into both nostrils daily as needed for allergies.     Multiple Minerals-Vitamins (CALCIUM-MAGNESIUM-ZINC-D3) TABS Take 2 tablets by mouth daily.     olopatadine (PATANOL) 0.1 % ophthalmic solution Place 1 drop into both eyes 2 (two) times daily as needed for allergies.     pantoprazole (PROTONIX) 40 MG tablet Take 1 tablet (40 mg total) by mouth 2 (two) times daily before a meal. 60 tablet 11   propranolol (INDERAL) 20 MG tablet Take 20 mg by mouth in the morning.     sennosides-docusate sodium (SENOKOT-S) 8.6-50 MG tablet Take 1 tablet by mouth 2 (two) times daily as needed for constipation.     Multiple Vitamins-Minerals (HAIR/SKIN/NAILS/BIOTIN PO) Take 1 tablet by mouth every evening. (Patient not taking: Reported on 09/22/2022)     OVER THE COUNTER MEDICATION Take 2 Doses/Fill by mouth daily. TAKE 2 TABLESPOONS PROBIOTIC SAUERKRAUT ONCE DAILY (Patient not taking: Reported on 09/22/2022)     No current facility-administered medications for this visit.    Allergies   Allergies as of 09/22/2022 - Review Complete 09/22/2022  Allergen Reaction Noted   Gramineae pollens Other (See Comments) 01/10/2016   Lidocaine Palpitations 08/01/2022   Shellfish allergy Other (See Comments) and Rash 01/10/2016      Review of Systems   General:  Negative for anorexia,   fever, chills, fatigue, weakness. See hpi ENT: Negative for hoarseness, difficulty swallowing , nasal congestion. CV: Negative for chest pain, angina, palpitations, dyspnea on exertion, peripheral edema.  Respiratory: Negative for dyspnea at rest, dyspnea on exertion, cough, sputum, wheezing.  GI: See history of present illness. GU:  Negative for dysuria, hematuria, urinary incontinence, urinary  frequency, nocturnal urination.  Endo: Negative for unusual weight change.     Physical Exam   BP (!) 147/78   Pulse 66   Temp 97.9 F (36.6 C)   Ht 5\' 3"  (1.6 m)   Wt 100 lb 9.6 oz (45.6 kg)   BMI 17.82 kg/m    General: Well-nourished, well-developed in no acute distress.  Eyes: No icterus. Mouth: Oropharyngeal mucosa moist and pink    Abdomen: Bowel sounds are normal, nontender, nondistended, no hepatosplenomegaly or masses,  no abdominal bruits or hernia , no rebound or guarding.  Rectal: not performed Extremities: No lower extremity edema. No clubbing or deformities. Neuro: Alert and oriented x 4   Skin: Warm and dry, no jaundice.   Psych: Alert and cooperative, normal mood and affect.  Labs   See hpi  Imaging Studies   No results found.  Assessment   *GERD *Gastritis *Weight loss  Recent flare of GERD while trying to wean off PPI. EGD showed erosive gastropathy. She is back on PPI BID, doing better. Frustrated with her five pound weight loss. She always struggles to put on weight. She has some nighttime reflux, in setting of eating late at night trying to get in extra calories.   PLAN   Continue pantoprazole 40mg  BID for now. Can try to decrease to once day 12/2022 if tolerated.  She can use TUMS or famotidine as needed for breakthrough heartburn.  Reinforced antireflux measures.  Avoid/limit NSAIDs. Increase oral intake, add 1-2 extra snacks daily to increase weight. We discussed nutritional snacks packed with calories.  Monitor weight, call with further loss.  Return ov in six months.    Leanna Battles. Melvyn Neth, MHS, PA-C [facility]

## 2022-09-22 NOTE — Patient Instructions (Signed)
Continue pantoprazole 40mg  twice daily through 11/2022, then you can try cutting back to once daily if tolerated.  You can use famotidine 20mg  twice daily as needed for breakthrough heartburn You can use TUMS as needed for heartburn. Try adding extra calories, 1-2 additional snacks daily to increase your weight.  Monitor your weight, call if ongoing weight loss.  Return to our office in six months.

## 2022-09-23 ENCOUNTER — Ambulatory Visit: Payer: Medicare HMO | Admitting: Gastroenterology

## 2022-11-12 ENCOUNTER — Telehealth: Payer: Self-pay

## 2022-11-12 NOTE — Telephone Encounter (Signed)
Pt was made aware and verbalized understanding.  

## 2022-11-12 NOTE — Telephone Encounter (Signed)
If she is now having some recurrence of symptoms since dropping the evening dose of pantoprazole I would suggest she take famotidine 20mg  at bedtime.

## 2022-11-12 NOTE — Telephone Encounter (Signed)
Pt called stating that she is having chest discomfort in the mornings when she gets up until after she takes her pantoprazole. Pt has been on once daily pantoprazole going on 3 weeks. Pt is wanting to know what you suggest.

## 2022-11-12 NOTE — Telephone Encounter (Signed)
No ans, vm full.  

## 2022-11-18 ENCOUNTER — Encounter: Payer: Self-pay | Admitting: Gastroenterology

## 2022-12-16 ENCOUNTER — Ambulatory Visit: Payer: Medicare HMO | Admitting: Gastroenterology

## 2022-12-16 ENCOUNTER — Encounter: Payer: Self-pay | Admitting: Gastroenterology

## 2022-12-16 VITALS — BP 146/78 | HR 71 | Temp 97.8°F | Ht 63.0 in | Wt 103.6 lb

## 2022-12-16 DIAGNOSIS — K219 Gastro-esophageal reflux disease without esophagitis: Secondary | ICD-10-CM | POA: Diagnosis not present

## 2022-12-16 NOTE — Patient Instructions (Signed)
Continue pantoprazole 40mg  30 minutes before breakfast. Take second dose 30 minutes before evening meal. Try this consistently over the next two weeks. If you still have problems with ongoing reflux/indigestion, we can switch you to another medication at that time. You can also use Pepcid (famotidine), Mylanta, or Tums as needed for breakthrough symptoms of heartburn/indigestion. Return office visit in six months.

## 2022-12-16 NOTE — Progress Notes (Signed)
GI Office Note    Referring Provider: Dan Humphreys, MD Primary Care Physician:  Dan Humphreys, MD  Primary Gastroenterologist: Roetta Sessions, MD   Chief Complaint   Chief Complaint  Patient presents with   Gastroesophageal Reflux    Was having issues feeling like food was getting stuck but since starting the pantoprazole twice daily it is getting better. Still has issues first thing in the morning.    History of Present Illness   Julie Mooney is a 67 y.o. female presenting today for follow up at the request of Dr. Yetta Flock. Last seen in 09/2022. H/O chronic GERD, constipation.  Last colonoscopy in 2015 at [facility], she had tortuous sigmoid colon, moderately severe proctitis, internal grade 2 hemorrhoids. Cologuard negative 08/2021, repeat in 3 years.   Labs 07/2022: White blood cell count 6100, hemoglobin 11.5, hematocrit 35.2, platelets 251,000, creatinine 0.8, total bilirubin 0.7, alkaline phosphatase 47, AST 20, ALT 18, albumin 4.1, lipase 51, ferritin 138, folate greater than 22, vitamin B12 434, iron 64, iron sats 25%, TIBC 259, TSH 0.82   Presenting back today as she recently had flare requiring increased back to BID pantoprazole.  Since back on twice daily dosing, she is improved.  Notes with recent flare she was afraid to eat although she had a good appetite.  At this time her breakthrough symptoms typically are first thing in the morning, describes having indigestion but no heartburn.  Denies dysphagia.  No abdominal pain.  Trying to eat better and drink more water to help her bowels.  Continues to have some intermittent hard stools.  Takes Senokot as needed.   She inquires about possibly switching PPIs.  Currently taking second dose of pantoprazole at bedtime for the most part.  She has tried a couple of times taking it before her evening meal but did not notice any difference.  Try sleeping propped up on pillows.  She does typically snack after her evening meal.  Tries  to eat her big evening meal before 7 but will have a snack before bed.  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      aspirin EC 81 MG tablet Take 81 mg by mouth in the morning.     Cyanocobalamin (VITAMIN B-12 PO) Take 1 tablet by mouth in the morning.     fluticasone (FLONASE) 50 MCG/ACT nasal spray Place 2 sprays into both nostrils daily as needed for allergies.     Multiple Minerals-Vitamins (CALCIUM-MAGNESIUM-ZINC-D3) TABS Take 2 tablets by mouth daily.     olopatadine (PATANOL) 0.1 % ophthalmic solution Place 1 drop into both eyes 2 (two) times daily as needed for allergies.     pantoprazole (PROTONIX) 40 MG tablet Take 1 tablet (40 mg total) by mouth 2 (two) times daily before a meal. 60 tablet 11   propranolol (INDERAL) 20 MG tablet Take 20 mg by mouth in the morning.  sennosides-docusate sodium (SENOKOT-S) 8.6-50 MG tablet Take 1 tablet by mouth 2 (two) times daily as needed for constipation.     No current facility-administered medications for this visit.    Allergies   Allergies as of 12/16/2022 - Review Complete 12/16/2022  Allergen Reaction Noted   Gramineae pollens Other (See Comments) 01/10/2016   Lidocaine Palpitations 08/01/2022   Shellfish allergy Other (See Comments) and Rash 01/10/2016     Review of Systems   General: Negative for anorexia, weight loss, fever, chills, fatigue,  weakness. ENT: Negative for hoarseness, difficulty swallowing , nasal congestion. CV: Negative for chest pain, angina, palpitations, dyspnea on exertion, peripheral edema.  Respiratory: Negative for dyspnea at rest, dyspnea on exertion, cough, sputum, wheezing.  GI: See history of present illness. GU:  Negative for dysuria, hematuria, urinary incontinence, urinary frequency, nocturnal urination.  Endo: Negative for unusual weight change.     Physical Exam   BP (!) 146/78 (BP Location: Right Arm, Patient Position: Sitting, Cuff Size: Normal)   Pulse 71   Temp 97.8 F (36.6 C) (Oral)   Ht 5\' 3"  (1.6 m)   Wt 103 lb 9.6 oz (47 kg)   SpO2 98%   BMI 18.35 kg/m    General: Well-nourished, well-developed in no acute distress.  Eyes: No icterus. Mouth: Oropharyngeal mucosa moist and pink  Abdomen: Bowel sounds are normal, nontender, nondistended, no hepatosplenomegaly or masses,  no abdominal bruits or hernia , no rebound or guarding.  Rectal: not performed  Extremities: No lower extremity edema. No clubbing or deformities. Neuro: Alert and oriented x 4   Skin: Warm and dry, no jaundice.   Psych: Alert and cooperative, normal mood and affect.  Labs   See hpi  Imaging Studies   No results found.  Assessment/Plan:   GERD  -doing better back on pantoprazole BID but still with some early morning reflux. We will have her take second dose 30 minutes before evening meal instead of at bedtime consistently over the next two weeks to see if this helps. -Reinforced antireflux measures -May use Pepcid, Mylanta, Tums for breakthrough symptoms -Return to the office in 6 months or call sooner if needed     Leanna Battles. Melvyn Neth, MHS, PA-C [facility]

## 2023-03-12 ENCOUNTER — Encounter: Payer: Self-pay | Admitting: *Deleted

## 2023-05-29 ENCOUNTER — Encounter: Payer: Self-pay | Admitting: Gastroenterology

## 2023-06-24 ENCOUNTER — Encounter: Payer: Self-pay | Admitting: Gastroenterology

## 2023-06-24 ENCOUNTER — Ambulatory Visit: Admitting: Gastroenterology

## 2023-06-24 VITALS — BP 115/71 | HR 79 | Temp 98.4°F | Ht 63.0 in | Wt 100.2 lb

## 2023-06-24 DIAGNOSIS — K219 Gastro-esophageal reflux disease without esophagitis: Secondary | ICD-10-CM

## 2023-06-24 DIAGNOSIS — K5900 Constipation, unspecified: Secondary | ICD-10-CM

## 2023-06-24 MED ORDER — PANTOPRAZOLE SODIUM 40 MG PO TBEC: 40.0000 mg | DELAYED_RELEASE_TABLET | Freq: Every day | ORAL | 3 refills | Status: AC

## 2023-06-24 NOTE — Patient Instructions (Signed)
 Continue pantoprazole  once daily before breakfast. If you have flare up of your reflux, you can take a second dose before supper. You can also use TUMS.  Continue to monitor your weight. Let me know if you have any weight loss.  Try to avoid acid reflux trigger foods. Typically things with red sauce. Acid fruits like pineapples, grapefruit, oranges.  You will be due Cologuard next 08/2024. Plan to come back in six months but if you are doing really well, you can change appointment to make is a year follow up instead.

## 2023-06-24 NOTE — Progress Notes (Signed)
 GI Office Note    Referring Provider: Julee Oak, MD Primary Care Physician:  Julee Oak, MD  Primary Gastroenterologist: Rheba Cedar, MD   Chief Complaint   Chief Complaint  Patient presents with   Follow-up    Still having issues with reflux. Only taking pantoprazole  once daily.    History of Present Illness   Julie Mooney is a 68 y.o. female presenting today for follow up. Last seen 12/2022. H/O chronic GERD, constipation.  Last colonoscopy in 2015 at [facility], she had tortuous sigmoid colon, moderately severe proctitis, internal grade 2 hemorrhoids. Cologuard negative 08/2021, repeat in 3 years.   Labs 05/2023: B12 1833, folate 17.5, hemoglobin 11.2, hematocrit 35, MCV 102, white blood cell count 6900, platelets 260,000, sodium 136, potassium 4, creatinine 0.8.  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 Also has been eating more pineapple. Had some heartburn overnight. BM doing well. Drinking more water . No melena, brbpr. No abdominal pain. No n/v. Overall feels much better in the past several months from a GI standpoint.     Wt Readings from Last 10 Encounters:  06/24/23 100 lb 3.2 oz (45.5 kg)  12/16/22 103 lb 9.6 oz (47 kg)  09/22/22 100 lb 9.6 oz (45.6 kg)  08/05/22 101 lb 6.6 oz (46 kg)  08/01/22 103 lb (46.7 kg)  06/24/22 106 lb 9.6 oz (48.4 kg)  01/11/22 106 lb 7.7 oz (48.3 kg)  07/26/21 106 lb 6.4 oz (48.3 kg)  04/04/20 110 lb 9.6 oz (50.2 kg)  03/23/18 117 lb 6.4 oz (53.3 kg)   Medications   Current Outpatient Medications  Medication Sig Dispense Refill   albuterol (PROVENTIL HFA;VENTOLIN HFA) 108 (90 Base) MCG/ACT inhaler Inhale 2 puffs into the lungs every 6 (six) hours as  needed for wheezing or shortness of breath.      aspirin EC 81 MG tablet Take 81 mg by mouth in the morning.     Cyanocobalamin (VITAMIN B-12 PO) Take 1 tablet by mouth in the morning.     fluticasone (FLONASE) 50 MCG/ACT nasal spray Place 2 sprays into both nostrils daily as needed for allergies.     Multiple Minerals-Vitamins (CALCIUM-MAGNESIUM-ZINC-D3) TABS Take 2 tablets by mouth daily.     olopatadine (PATANOL) 0.1 % ophthalmic solution Place 1 drop into both eyes 2 (two) times daily as needed for allergies.     pantoprazole  (PROTONIX ) 40 MG tablet Take 1 tablet (40 mg total) by mouth 2 (two) times daily before a meal. (Patient taking differently: Take 40 mg by mouth daily before breakfast.) 60 tablet 11   propranolol (INDERAL) 20 MG tablet Take 20 mg by mouth in the morning.     sennosides-docusate sodium (SENOKOT-S) 8.6-50 MG tablet Take 1 tablet by mouth 2 (two) times daily as needed for constipation.     No current facility-administered medications for this visit.    Allergies   Allergies as of 06/24/2023 - Review Complete 06/24/2023  Allergen Reaction Noted   Gramineae pollens Other (See Comments) 01/10/2016   Sodium Other (See Comments) 04/09/2023   Lidocaine  Palpitations 08/01/2022   Shellfish allergy Other (See Comments) and Rash 01/10/2016       Review of Systems   General: Negative for anorexia, weight loss, fever, chills, fatigue,  weakness. ENT: Negative for hoarseness, difficulty swallowing , nasal congestion. CV: Negative for chest pain, angina, palpitations, dyspnea on exertion, peripheral edema.  Respiratory: Negative for dyspnea at rest, dyspnea on exertion, cough, sputum, wheezing.  GI: See history of present illness. GU:  Negative for dysuria, hematuria, urinary incontinence, urinary frequency, nocturnal urination.  Endo: Negative for unusual weight change.     Physical Exam   BP 115/71 (BP Location: Right Arm, Patient Position: Sitting, Cuff Size: Normal)    Pulse 79   Temp 98.4 F (36.9 C) (Oral)   Ht 5\' 3"  (1.6 m)   Wt 100 lb 3.2 oz (45.5 kg)   BMI 17.75 kg/m    General: Well-nourished, well-developed in no acute distress.  Eyes: No icterus. Mouth: Oropharyngeal mucosa moist and pink   Lungs: Clear to auscultation bilaterally.  Heart: Regular rate and rhythm, no murmurs rubs or gallops.  Abdomen: Bowel sounds are normal, nontender, nondistended, no hepatosplenomegaly or masses,  no abdominal bruits or hernia , no rebound or guarding.  Rectal: not performed  Extremities: No lower extremity edema. No clubbing or deformities. Neuro: Alert and oriented x 4   Skin: Warm and dry, no jaundice.   Psych: Alert and cooperative, normal mood and affect.  Labs   See hpi Imaging Studies   No results found.  Assessment/Plan:   GERD: doing better. Had some breakthrough symptoms with trigger foods -continue pantoprazole  40mg  daily before breakfast. Can take second dose for flares or use TUMS -reinforced antireflux measures -monitor for weight loss -return ov in six months. If she is doing well at that time, we can push out to a one year follow up.    Trudie Fuse. Harles Lied, MHS, PA-C [facility]

## 2023-07-01 ENCOUNTER — Other Ambulatory Visit: Payer: Self-pay

## 2023-07-01 DIAGNOSIS — Z1231 Encounter for screening mammogram for malignant neoplasm of breast: Secondary | ICD-10-CM

## 2023-07-15 ENCOUNTER — Ambulatory Visit
Admission: RE | Admit: 2023-07-15 | Discharge: 2023-07-15 | Disposition: A | Discharge status: Home / Self Care | Source: Ambulatory Visit

## 2023-07-15 DIAGNOSIS — Z1231 Encounter for screening mammogram for malignant neoplasm of breast: Secondary | ICD-10-CM

## 2023-07-20 ENCOUNTER — Other Ambulatory Visit: Payer: Self-pay | Admitting: Physician Assistant

## 2023-07-20 DIAGNOSIS — R928 Other abnormal and inconclusive findings on diagnostic imaging of breast: Secondary | ICD-10-CM

## 2023-07-21 ENCOUNTER — Ambulatory Visit
Admission: RE | Admit: 2023-07-21 | Discharge: 2023-07-21 | Disposition: A | Discharge status: Home / Self Care | Source: Ambulatory Visit | Attending: Physician Assistant | Admitting: Physician Assistant

## 2023-07-21 DIAGNOSIS — R928 Other abnormal and inconclusive findings on diagnostic imaging of breast: Secondary | ICD-10-CM | POA: Insufficient documentation

## 2023-10-20 ENCOUNTER — Ambulatory Visit: Admitting: Gastroenterology

## 2023-10-20 NOTE — Progress Notes (Deleted)
 GI Office Note    Referring Provider: Clora Marlee RODES, MD Primary Care Physician:  Clora Marlee RODES, MD  Primary Gastroenterologist: Ozell Hollingshead, MD   Chief Complaint   No chief complaint on file.   History of Present Illness   Julie Mooney is a 68 y.o. female presenting today for follow-up.  She is last seen in April 2025.  History of chronic GERD, constipation.   Prior Data   Last colonoscopy in 2015 at [facility], she had tortuous sigmoid colon, moderately severe proctitis, internal grade 2 hemorrhoids. Cologuard negative 08/2021, repeat in 3 years.    Labs 05/2023: B12 1833, folate 17.5, hemoglobin 11.2, hematocrit 35, MCV 102, white blood cell count 6900, platelets 260,000, sodium 136, potassium 4, creatinine 0.8.  EGD 08/2022: -antral erosions s/p bx -erosive gastropathy with stigmata of recent bleeding -normal esophagus -gastric antral mucosa showing marked nonspecific reactive gastropathy with changes s/o adjacent ulceration/erosion, no H.pylori       Medications   Current Outpatient Medications  Medication Sig Dispense Refill   albuterol (PROVENTIL HFA;VENTOLIN HFA) 108 (90 Base) MCG/ACT inhaler Inhale 2 puffs into the lungs every 6 (six) hours as needed for wheezing or shortness of breath.      aspirin EC 81 MG tablet Take 81 mg by mouth in the morning.     Cyanocobalamin (VITAMIN B-12 PO) Take 1 tablet by mouth in the morning.     fluticasone (FLONASE) 50 MCG/ACT nasal spray Place 2 sprays into both nostrils daily as needed for allergies.     Multiple Minerals-Vitamins (CALCIUM-MAGNESIUM-ZINC-D3) TABS Take 2 tablets by mouth daily.     olopatadine (PATANOL) 0.1 % ophthalmic solution Place 1 drop into both eyes 2 (two) times daily as needed for allergies.     pantoprazole  (PROTONIX ) 40 MG tablet Take 1 tablet (40 mg total) by mouth daily before breakfast. 90 tablet 3   propranolol (INDERAL) 20 MG tablet Take 20 mg by mouth in the morning.      sennosides-docusate sodium (SENOKOT-S) 8.6-50 MG tablet Take 1 tablet by mouth 2 (two) times daily as needed for constipation.     No current facility-administered medications for this visit.    Allergies   Allergies as of 10/20/2023 - Review Complete 06/24/2023  Allergen Reaction Noted   Gramineae pollens Other (See Comments) 01/10/2016   Sodium Other (See Comments) 04/09/2023   Lidocaine  Palpitations 08/01/2022   Shellfish allergy Other (See Comments) and Rash 01/10/2016     Past Medical History   Past Medical History:  Diagnosis Date   Anxiety    Arthritis    Asthma    Depression    GERD (gastroesophageal reflux disease)    Hypertension    MVP (mitral valve prolapse)    Seasonal allergies    Stroke (HCC) 2010   TIA    Past Surgical History   Past Surgical History:  Procedure Laterality Date   ABDOMINAL HYSTERECTOMY     BIOPSY  08/05/2022   Procedure: BIOPSY;  Surgeon: Hollingshead Lamar HERO, MD;  Location: [facility];  Service: Endoscopy;;   COLONOSCOPY  08/2013   [facility]: tortuosity of sigm colon, moderately severe proctitis internal grade II hemorrhoids.    ESOPHAGOGASTRODUODENOSCOPY N/A 01/14/2016   Normal    ESOPHAGOGASTRODUODENOSCOPY (EGD) WITH PROPOFOL  N/A 08/05/2022   Procedure: ESOPHAGOGASTRODUODENOSCOPY (EGD) WITH PROPOFOL ;  Surgeon: Hollingshead Lamar HERO, MD;  Location: [facility];  Service: Endoscopy;  Laterality: N/A;  2:15 pm, asa 2, pt knows  to arrive at 6:15    Past Family History   Family History  Problem Relation Age of Onset   Breast cancer Maternal Aunt 2   Breast cancer Maternal Aunt    Breast cancer Maternal Aunt    Colon cancer Neg Hx     Past Social History   Social History   Socioeconomic History   Marital status: Married    Spouse name: Not on file   Number of children: Not on file   Years of education: Not on file   Highest education level: Not on file  Occupational History   Not on file  Tobacco Use   Smoking status:  Never   Smokeless tobacco: Never  Substance and Sexual Activity   Alcohol use: No   Drug use: Never   Sexual activity: Yes  Other Topics Concern   Not on file  Social History Narrative   Retired - worked for school systems   Social Drivers of Corporate investment banker Strain: Low Risk  (11/12/2022)   Received from [facility]   Overall Financial Resource Strain (CARDIA)    Difficulty of Paying Living Expenses: Not hard at all  Food Insecurity: No Food Insecurity (11/12/2022)   Received from [facility]   Hunger Vital Sign    Within the past 12 months, you worried that your food would run out before you got the money to buy more.: Never true    Within the past 12 months, the food you bought just didn't last and you didn't have money to get more.: Never true  Transportation Needs: No Transportation Needs (11/12/2022)   Received from [facility] - Transportation    In the past 12 months, has lack of transportation kept you from medical appointments or from getting medications?: No    Lack of Transportation (Non-Medical): No  Physical Activity: Not on file  Stress: Not on file  Social Connections: Not on file  Intimate Partner Violence: Not on file    Review of Systems   General: Negative for anorexia, weight loss, fever, chills, fatigue, weakness. ENT: Negative for hoarseness, difficulty swallowing , nasal congestion. CV: Negative for chest pain, angina, palpitations, dyspnea on exertion, peripheral edema.  Respiratory: Negative for dyspnea at rest, dyspnea on exertion, cough, sputum, wheezing.  GI: See history of present illness. GU:  Negative for dysuria, hematuria, urinary incontinence, urinary frequency, nocturnal urination.  Endo: Negative for unusual weight change.     Physical Exam   There were no vitals taken for this visit.   General: Well-nourished, well-developed in no acute distress.  Eyes: No  icterus. Mouth: Oropharyngeal mucosa moist and pink   Lungs: Clear to auscultation bilaterally.  Heart: Regular rate and rhythm, no murmurs rubs or gallops.  Abdomen: Bowel sounds are normal, nontender, nondistended, no hepatosplenomegaly or masses,  no abdominal bruits or hernia , no rebound or guarding.  Rectal: not performed Extremities: No lower extremity edema. No clubbing or deformities. Neuro: Alert and oriented x 4   Skin: Warm and dry, no jaundice.   Psych: Alert and cooperative, normal mood and affect.  Labs   *** Imaging Studies   No results found.  Assessment/Plan:   Mild anemia in March, need to rule out IDA, may need updated colonoscopy       Sonny RAMAN. Ezzard, MHS, PA-C [facility]

## 2023-11-16 ENCOUNTER — Encounter: Payer: Self-pay | Admitting: Internal Medicine
